# Patient Record
Sex: Male | Born: 1955
Health system: Southern US, Community
[De-identification: ages and names within clinical notes are randomized; demographics above are authoritative.]

## PROBLEM LIST (undated history)

## (undated) DIAGNOSIS — Z9109 Other allergy status, other than to drugs and biological substances: Secondary | ICD-10-CM

## (undated) DIAGNOSIS — N200 Calculus of kidney: Secondary | ICD-10-CM

## (undated) DIAGNOSIS — N159 Renal tubulo-interstitial disease, unspecified: Secondary | ICD-10-CM

## (undated) DIAGNOSIS — N400 Enlarged prostate without lower urinary tract symptoms: Secondary | ICD-10-CM

## (undated) DIAGNOSIS — C439 Malignant melanoma of skin, unspecified: Secondary | ICD-10-CM

## (undated) DIAGNOSIS — E785 Hyperlipidemia, unspecified: Secondary | ICD-10-CM

## (undated) DIAGNOSIS — I10 Essential (primary) hypertension: Secondary | ICD-10-CM

## (undated) HISTORY — PX: WISDOM TOOTH EXTRACTION: SHX21

## (undated) HISTORY — DX: Renal tubulo-interstitial disease, unspecified: N15.9

## (undated) HISTORY — DX: Malignant melanoma of skin, unspecified: C43.9

## (undated) HISTORY — DX: Calculus of kidney: N20.0

## (undated) HISTORY — DX: Hyperlipidemia, unspecified: E78.5

## (undated) HISTORY — DX: Essential (primary) hypertension: I10

## (undated) HISTORY — DX: Benign prostatic hyperplasia without lower urinary tract symptoms: N40.0

## (undated) HISTORY — PX: COLONOSCOPY: SHX174

## (undated) HISTORY — PX: MELANOMA EXCISION: SHX5266

## (undated) HISTORY — DX: Other allergy status, other than to drugs and biological substances: Z91.09

## (undated) HISTORY — PX: LITHOTRIPSY: SUR834

---

## 2009-12-05 ENCOUNTER — Ambulatory Visit (HOSPITAL_COMMUNITY): Admission: RE | Admit: 2009-12-05 | Discharge: 2009-12-05 | Payer: Self-pay | Admitting: Internal Medicine

## 2011-10-22 ENCOUNTER — Ambulatory Visit (INDEPENDENT_AMBULATORY_CARE_PROVIDER_SITE_OTHER): Payer: 59

## 2011-10-22 DIAGNOSIS — R51 Headache: Secondary | ICD-10-CM

## 2011-10-22 DIAGNOSIS — J019 Acute sinusitis, unspecified: Secondary | ICD-10-CM

## 2014-03-15 ENCOUNTER — Encounter: Payer: Self-pay | Admitting: Family Medicine

## 2014-03-15 ENCOUNTER — Ambulatory Visit (INDEPENDENT_AMBULATORY_CARE_PROVIDER_SITE_OTHER): Payer: 59 | Admitting: Family Medicine

## 2014-03-15 VITALS — BP 152/92 | HR 81 | Ht 70.0 in | Wt 199.0 lb

## 2014-03-15 DIAGNOSIS — M7580 Other shoulder lesions, unspecified shoulder: Secondary | ICD-10-CM

## 2014-03-15 DIAGNOSIS — M7541 Impingement syndrome of right shoulder: Secondary | ICD-10-CM | POA: Insufficient documentation

## 2014-03-15 DIAGNOSIS — I10 Essential (primary) hypertension: Secondary | ICD-10-CM

## 2014-03-15 DIAGNOSIS — M25819 Other specified joint disorders, unspecified shoulder: Secondary | ICD-10-CM

## 2014-03-15 DIAGNOSIS — N400 Enlarged prostate without lower urinary tract symptoms: Secondary | ICD-10-CM | POA: Insufficient documentation

## 2014-03-15 DIAGNOSIS — E785 Hyperlipidemia, unspecified: Secondary | ICD-10-CM | POA: Insufficient documentation

## 2014-03-15 DIAGNOSIS — Z85820 Personal history of malignant melanoma of skin: Secondary | ICD-10-CM | POA: Insufficient documentation

## 2014-03-15 MED ORDER — DOXAZOSIN MESYLATE 4 MG PO TABS: 4.0000 mg | ORAL_TABLET | Freq: Every day | ORAL | Status: DC

## 2014-03-15 NOTE — Progress Notes (Signed)
CC: Derrick Blackwell is a 58 y.o. male is here for Establish Care   Subjective: HPI:  Very pleasant CF [employer] here to establish care  Patient complains of right shoulder pain has been present for the past 2-4 weeks. Presently daily basis. Worse with trying to put his hand behind his back or abducting the humerus beyond 90. Localized to the top of the shoulder and nonradiating. Denies any recent or remote injury or overexertion. Denies any motor sensory disturbances in the right upper extremity. Denies any overlying skin changes or swelling. He has never had this before. Is moderate in severity. Absent when his arm is placed over his abdomen.  Complains of weak stream, dribbling of his urine at the end of urination, and awakening on average 2 times a night to urinate. Symptoms have been present for the last month. When his bladder is full he feels new low back pain right and left just lateral to the lumbar spine. No midline lumbar pain. Symptoms are moderate in severity is a daily basis. Nothing particularly makes better or worse. Symptoms began when he had left flank pain typical of his long history of nephrolithiasis however that resolved within hours and has been left with the above symptoms.  Reports a history of hypertension without any blood pressure medication recently. He does not, he's never been on blood pressure medication before.  Review of Systems - General ROS: negative for - chills, fever, night sweats, weight gain or weight loss Ophthalmic ROS: negative for - decreased vision Psychological ROS: negative for - anxiety or depression ENT ROS: negative for - hearing change, nasal congestion, tinnitus or allergies Hematological and Lymphatic ROS: negative for - bleeding problems, bruising or swollen lymph nodes Breast ROS: negative Respiratory ROS: no cough, shortness of breath, or wheezing Cardiovascular ROS: no chest pain or dyspnea on exertion Gastrointestinal ROS:  no abdominal pain, change in bowel habits, or black or bloody stools Genito-Urinary ROS: negative for - genital discharge, genital ulcers, incontinence or abnormal bleeding from genitals Musculoskeletal ROS: negative for - joint pain or muscle pain other than that described above Neurological ROS: negative for - headaches or memory loss Dermatological ROS: negative for lumps, mole changes, rash and skin lesion changes  Past Medical History  Diagnosis Date  . Hypertension   . Melanoma   . Hyperlipidemia     History reviewed. No pertinent past surgical history. Family History  Problem Relation Age of Onset  . Breast cancer Mother     History   Social History  . Marital Status: Married    Spouse Name: N/A    Number of Children: N/A  . Years of Education: N/A   Occupational History  . Not on file.   Social History Main Topics  . Smoking status: Unknown If Ever Smoked  . Smokeless tobacco: Not on file  . Alcohol Use: 0.5 oz/week    1 drink(s) per week  . Drug Use: No  . Sexual Activity: Yes    Partners: Female   Other Topics Concern  . Not on file   Social History Narrative  . No narrative on file     Objective: BP 152/92  Pulse 81  Ht 5\' 10"  (1.778 m)  Wt 199 lb (90.266 kg)  BMI 28.55 kg/m2  General: Alert and Oriented, No Acute Distress HEENT: Pupils equal, round, reactive to light. Conjunctivae clear.  Moist mucous membranes pharynx unremarkable Lungs: Clear to auscultation bilaterally, no wheezing/ronchi/rales.  Comfortable work  of breathing. Good air movement. Cardiac: Regular rate and rhythm. Normal S1/S2.  No murmurs, rubs, nor gallops.   Right shoulder exam reveals full range of motion and strength in all planes of motion and with individual rotator cuff testing. No overlying redness warmth or swelling.  Neer's test positive.  Hawkins test negative. Empty can positive. Crossarm test negative. O'Brien's test negative. Apprehension test negative. Speed's test  negative. Pain is reproduced with internal rotation of the humerus. Extremities: No peripheral edema.  Strong peripheral pulses.  Mental Status: No depression, anxiety, nor agitation. Skin: Warm and dry.  Assessment & Plan: Derrick Blackwell was seen today for establish care.  Diagnoses and associated orders for this visit:  Hypertension  BPH (benign prostatic hyperplasia) - doxazosin (CARDURA) 4 MG tablet; Take 1 tablet (4 mg total) by mouth daily.  Impingement syndrome of right shoulder    BPH: Uncontrolled chronic conditions start doxazosin Hypertension: Uncontrolled chronic condition began to doxazosin for both coverage of BPH and hypertensive issues. Impingement syndrome of the right shoulder. We discussed oral anti-inflammatories, home rehabilitation, and subacromial injection all of which are options for treatment of this. He prefers home rehabilitation and injection today.  Subacromial Shoulder Injection Procedure Note  Pre-operative Diagnosis: Right impingement syndrome  Post-operative Diagnosis: same  Indications: symptomatic relief  Anesthesia: Topical cold spray  Procedure Details   Verbal consent was obtained for the procedure. The shoulder was prepped with alcohol and the skin was anesthetized. A 25 gauge needle was advanced into the subacromial space through posterior approach without difficulty  The space was then injected with 2 ml 1% lidocaine and 2 ml of triamcinolone (KENALOG) 40mg /ml. The injection site was cleansed with isopropyl alcohol and a dressing was applied.  Complications:  None; patient tolerated the procedure well.   Return in about 3 months (around 06/15/2014).

## 2014-06-15 ENCOUNTER — Ambulatory Visit (INDEPENDENT_AMBULATORY_CARE_PROVIDER_SITE_OTHER): Payer: 59 | Admitting: Family Medicine

## 2014-06-15 ENCOUNTER — Ambulatory Visit (INDEPENDENT_AMBULATORY_CARE_PROVIDER_SITE_OTHER): Payer: 59

## 2014-06-15 ENCOUNTER — Encounter: Payer: Self-pay | Admitting: Family Medicine

## 2014-06-15 VITALS — BP 131/85 | HR 85 | Wt 188.0 lb

## 2014-06-15 DIAGNOSIS — M25819 Other specified joint disorders, unspecified shoulder: Secondary | ICD-10-CM

## 2014-06-15 DIAGNOSIS — M25519 Pain in unspecified shoulder: Secondary | ICD-10-CM

## 2014-06-15 DIAGNOSIS — M7541 Impingement syndrome of right shoulder: Secondary | ICD-10-CM

## 2014-06-15 DIAGNOSIS — I10 Essential (primary) hypertension: Secondary | ICD-10-CM

## 2014-06-15 DIAGNOSIS — M7580 Other shoulder lesions, unspecified shoulder: Secondary | ICD-10-CM

## 2014-06-15 DIAGNOSIS — R1031 Right lower quadrant pain: Secondary | ICD-10-CM

## 2014-06-15 DIAGNOSIS — N400 Enlarged prostate without lower urinary tract symptoms: Secondary | ICD-10-CM

## 2014-06-15 MED ORDER — DOXAZOSIN MESYLATE 4 MG PO TABS
4.0000 mg | ORAL_TABLET | Freq: Every day | ORAL | Status: DC
Start: 2014-06-15 — End: 2015-04-25

## 2014-06-15 NOTE — Progress Notes (Signed)
CC: Derrick Blackwell is a 58 y.o. male is here for Hypertension and Benign Prostatic Hypertrophy   Subjective: HPI:  Followup BPH: Since starting doxazosin he has noticed no known side effects. He states he is no longer awakening more than once to urinate in the middle the night and mostly not awakening at all. No longer having to strain or have any hesitancy with urination. He believes the medications working great for his BPH  Followup hypertension: No outside blood pressures to report. Denies chest pain shortness of breath orthopnea nor peripheral edema.  Impingement syndrome of the right shoulder: Patient states the symptoms were relieved for 3 days after subacromial injection at his last visit. They're now back worse with abduction of the arm beyond 90 or any overhead activities. Described as a sharpness and pulling sensation in the right shoulder and non-radiating. Denies any motor or sensory disturbances nor neck pain.  Complains of right lower quadrant abdominal pain has been present for the last year. It is described as moderate and severe and only occurs at night after he's been asleep and when he rotates to the left quickly. It is never present during the mornings or when out of bed. Nothing else makes better or worse. Has never been accompanied by habits, diarrhea, constipation, nausea, vomiting, nor accompanied by fevers   Review Of Systems Outlined In HPI  Past Medical History  Diagnosis Date  . Hypertension   . Melanoma   . Hyperlipidemia     No past surgical history on file. Family History  Problem Relation Age of Onset  . Breast cancer Mother     History   Social History  . Marital Status: Married    Spouse Name: N/A    Number of Children: N/A  . Years of Education: N/A   Occupational History  . Not on file.   Social History Main Topics  . Smoking status: Unknown If Ever Smoked  . Smokeless tobacco: Not on file  . Alcohol Use: 0.5 oz/week    1 drink(s) per  week  . Drug Use: No  . Sexual Activity: Yes    Partners: Female   Other Topics Concern  . Not on file   Social History Narrative  . No narrative on file     Objective: BP 131/85  Pulse 85  Wt 188 lb (85.276 kg)  General: Alert and Oriented, No Acute Distress HEENT: Pupils equal, round, reactive to light. Conjunctivae clear.  Moist mucous membranes pharynx unremarkable Lungs: Clear to auscultation bilaterally, no wheezing/ronchi/rales.  Comfortable work of breathing. Good air movement. Cardiac: Regular rate and rhythm. Normal S1/S2.  No murmurs, rubs, nor gallops.   Abdomen: Normal bowel sounds, soft and non tender without rebound or guarding. In the right lower quadrant just medial to the ASIS there is a small to fingerpad witdth shallow buldge that occurs when coughing but there is no palpable abdominal wall defect Extremities: No peripheral edema.  Strong peripheral pulses.  Mental Status: No depression, anxiety, nor agitation. Skin: Warm and dry.  Assessment & Plan: Derrick Blackwell was seen today for hypertension and benign prostatic hypertrophy.  Diagnoses and associated orders for this visit:  BPH (benign prostatic hyperplasia) - doxazosin (CARDURA) 4 MG tablet; Take 1 tablet (4 mg total) by mouth daily.  Essential hypertension  Impingement syndrome of right shoulder - DG Shoulder Right; Future  RLQ abdominal pain    DBH: Controlled continue doxazosin Essential hypertension: Controlled continue doxazosin Impingement syndrome: Shoulder films reveal mild  degenerative changes on the bottom of a.c. joint however I cannot 100% attributes this to his pain, I recommended a followup in sports medicine for further evaluation to see Dr. Darene Lamer. For the time being begin home rehabilitation plans were provided to him at the last visit Right lower quadrant pain: Reassurance provided that this is most likely musculoskeletal and due to weak abdominal musculature, he was provided with a home  rehabilitation plan to focus on rehabilitating his core to be performed on a daily basis the next 3-4 weeks  Return in about 3 months (around 09/15/2014).

## 2015-04-11 ENCOUNTER — Other Ambulatory Visit: Payer: Self-pay | Admitting: Family Medicine

## 2015-04-25 ENCOUNTER — Ambulatory Visit (INDEPENDENT_AMBULATORY_CARE_PROVIDER_SITE_OTHER): Payer: 59 | Admitting: Family Medicine

## 2015-04-25 ENCOUNTER — Encounter: Payer: Self-pay | Admitting: Family Medicine

## 2015-04-25 VITALS — BP 155/92 | HR 72 | Wt 206.0 lb

## 2015-04-25 DIAGNOSIS — Z0000 Encounter for general adult medical examination without abnormal findings: Secondary | ICD-10-CM

## 2015-04-25 DIAGNOSIS — Z23 Encounter for immunization: Secondary | ICD-10-CM | POA: Diagnosis not present

## 2015-04-25 LAB — CBC
HCT: 44.6 % (ref 39.0–52.0)
HEMOGLOBIN: 15.3 g/dL (ref 13.0–17.0)
MCH: 28.7 pg (ref 26.0–34.0)
MCHC: 34.3 g/dL (ref 30.0–36.0)
MCV: 83.5 fL (ref 78.0–100.0)
MPV: 11 fL (ref 8.6–12.4)
Platelets: 146 10*3/uL — ABNORMAL LOW (ref 150–400)
RBC: 5.34 MIL/uL (ref 4.22–5.81)
RDW: 13.7 % (ref 11.5–15.5)
WBC: 5.4 10*3/uL (ref 4.0–10.5)

## 2015-04-25 LAB — COMPLETE METABOLIC PANEL WITH GFR
ALK PHOS: 44 U/L (ref 39–117)
ALT: 16 U/L (ref 0–53)
AST: 14 U/L (ref 0–37)
Albumin: 4.5 g/dL (ref 3.5–5.2)
BILIRUBIN TOTAL: 0.5 mg/dL (ref 0.2–1.2)
BUN: 14 mg/dL (ref 6–23)
CO2: 29 mEq/L (ref 19–32)
Calcium: 9.3 mg/dL (ref 8.4–10.5)
Chloride: 98 mEq/L (ref 96–112)
Creat: 0.91 mg/dL (ref 0.50–1.35)
GFR, Est African American: >89 mL/min
GLUCOSE: 125 mg/dL — AB (ref 70–99)
POTASSIUM: 4.2 meq/L (ref 3.5–5.3)
Sodium: 136 mEq/L (ref 135–145)
Total Protein: 7.2 g/dL (ref 6.0–8.3)

## 2015-04-25 LAB — LIPID PANEL
CHOL/HDL RATIO: 7.8 ratio
Cholesterol: 256 mg/dL — ABNORMAL HIGH (ref 0–200)
HDL: 33 mg/dL — AB (ref >=40)
LDL CALC: 157 mg/dL — AB (ref 0–99)
TRIGLYCERIDES: 332 mg/dL — AB (ref <150)
VLDL: 66 mg/dL — ABNORMAL HIGH (ref 0–40)

## 2015-04-25 MED ORDER — DOXAZOSIN MESYLATE 4 MG PO TABS: 4.0000 mg | ORAL_TABLET | Freq: Every day | ORAL | Status: DC

## 2015-04-25 NOTE — Progress Notes (Signed)
CC: Derrick Blackwell is a 59 y.o. male is here for Annual Exam   Subjective: HPI:  Colonoscopy: He is competent he had a colonoscopy within the last 10 years and it was normal, he thinks next year will be the 10th year anniversary Prostate: Discussed screening risks/beneifts with patient during today's visit he is open to the idea of getting a PSA    Influenza Vaccine: Up-to-date Pneumovax: Up-to-date Td/Tdap: Overdue he will receive Tdap today Zoster: (Start 59 yo)  Requesting refills on doxazosin that he's run out of.  Requesting complete physical exam  Review of Systems - General ROS: negative for - chills, fever, night sweats, weight gain or weight loss Ophthalmic ROS: negative for - decreased vision Psychological ROS: negative for - anxiety or depression ENT ROS: negative for - hearing change, nasal congestion, tinnitus or allergies Hematological and Lymphatic ROS: negative for - bleeding problems, bruising or swollen lymph nodes Breast ROS: negative Respiratory ROS: no cough, shortness of breath, or wheezing Cardiovascular ROS: no chest pain or dyspnea on exertion Gastrointestinal ROS: no abdominal pain, change in bowel habits, or black or bloody stools Genito-Urinary ROS: negative for - genital discharge, genital ulcers, incontinence or abnormal bleeding from genitals Musculoskeletal ROS: negative for - joint pain or muscle pain Neurological ROS: negative for - headaches or memory loss Dermatological ROS: negative for lumps, mole changes, rash and skin lesion changes  Past Medical History  Diagnosis Date  . Hypertension   . Melanoma   . Hyperlipidemia     No past surgical history on file. Family History  Problem Relation Age of Onset  . Breast cancer Mother     History   Social History  . Marital Status: Married    Spouse Name: N/A  . Number of Children: N/A  . Years of Education: N/A   Occupational History  . Not on file.   Social History Main Topics  .  Smoking status: Unknown If Ever Smoked  . Smokeless tobacco: Not on file  . Alcohol Use: 0.5 oz/week    1 drink(s) per week  . Drug Use: No  . Sexual Activity:    Partners: Female   Other Topics Concern  . Not on file   Social History Narrative     Objective: BP 155/92 mmHg  Pulse 72  Wt 206 lb (93.441 kg)  General: No Acute Distress HEENT: Atraumatic, normocephalic, conjunctivae normal without scleral icterus.  No nasal discharge, hearing grossly intact, TMs with good landmarks bilaterally with no middle ear abnormalities, posterior pharynx clear without oral lesions. Neck: Supple, trachea midline, no cervical nor supraclavicular adenopathy. Pulmonary: Clear to auscultation bilaterally without wheezing, rhonchi, nor rales. Cardiac: Regular rate and rhythm.  No murmurs, rubs, nor gallops. No peripheral edema.  2+ peripheral pulses bilaterally. Abdomen: Bowel sounds normal.  No masses.  Non-tender without rebound.  Negative Murphy's sign. MSK: Grossly intact, no signs of weakness.  Full strength throughout upper and lower extremities.  Full ROM in upper and lower extremities.  No midline spinal tenderness. Neuro: Gait unremarkable, CN II-XII grossly intact.  C5-C6 Reflex 2/4 Bilaterally, L4 Reflex 2/4 Bilaterally.  Cerebellar function intact. Skin: No rashes. Psych: Alert and oriented to person/place/time.  Thought process normal. No anxiety/depression.  Assessment & Plan: Talyn was seen today for annual exam.  Diagnoses and all orders for this visit:  Annual physical exam Orders: -     Lipid panel -     PSA -     CBC -  COMPLETE METABOLIC PANEL WITH GFR -     Tdap vaccine greater than or equal to 7yo IM  Other orders -     doxazosin (CARDURA) 4 MG tablet; Take 1 tablet (4 mg total) by mouth daily.   Healthy lifestyle interventions including but not limited to regular exercise, a healthy low fat diet, moderation of salt intake, the dangers of  tobacco/alcohol/recreational drug use, nutrition supplementation, and accident avoidance were discussed with the patient and a handout was provided for future reference.  Return in about 3 months (around 07/26/2015) for BP Review.

## 2015-04-26 ENCOUNTER — Telehealth: Payer: Self-pay | Admitting: Family Medicine

## 2015-04-26 DIAGNOSIS — R739 Hyperglycemia, unspecified: Secondary | ICD-10-CM

## 2015-04-26 DIAGNOSIS — E785 Hyperlipidemia, unspecified: Secondary | ICD-10-CM

## 2015-04-26 LAB — PSA: PSA: 0.96 ng/mL (ref <=4.00)

## 2015-04-26 NOTE — Telephone Encounter (Signed)
Pt.notified

## 2015-04-26 NOTE — Telephone Encounter (Signed)
Derrick Blackwell, Will you please let patient know that his PSA prostate test, blood cell counts, liver function, and kidney function were normal.  His cholesterol was moderately elevated to a degree that puts him at risk of developing cardiovascular disease.  This can be improved with engaging in 30-45 minutes of moderate exercise most days of the week, reducing cholesterol in the diet, and avoiding all fried foods.  I'd recommend he return to have this rechecked in three months.  Also his blood sugar was moderately elevated and I'd recommend he either have an A1c added on to his blood work from yesterday or return to the lab to have an A1c checked if the lab can't do the add on.  Lab slip in your in box.

## 2015-07-31 ENCOUNTER — Encounter: Payer: Self-pay | Admitting: *Deleted

## 2015-07-31 ENCOUNTER — Emergency Department
Admission: EM | Admit: 2015-07-31 | Discharge: 2015-07-31 | Disposition: A | Discharge status: Home / Self Care | Payer: 59 | Source: Home / Self Care | Attending: Family Medicine | Admitting: Family Medicine

## 2015-07-31 DIAGNOSIS — S80811A Abrasion, right lower leg, initial encounter: Secondary | ICD-10-CM | POA: Diagnosis not present

## 2015-07-31 MED ORDER — MUPIROCIN 2 % EX OINT: 1.0000 "application " | TOPICAL_OINTMENT | Freq: Three times a day (TID) | CUTANEOUS | Status: DC

## 2015-07-31 NOTE — ED Provider Notes (Signed)
CSN: [id number]     Arrival date & time 07/31/15  1756 History   First MD Initiated Contact with Patient 07/31/15 1819     Chief Complaint  Patient presents with  . Wound Infection     HPI Comments: Patient reports that he lacerated his lower legs four days ago on barbed wire.  He has been applying Neosporin ointment, and he wonders if a lesion on his right lower leg is infected.  His Tdap is current.  Patient is a 59 y.o. male presenting with skin laceration. The history is provided by the patient.  Laceration Location:  Leg Leg laceration location:  R lower leg Length (cm):  1.5 Depth:  Cutaneous Quality: straight   Bleeding: controlled   Time since incident:  4 days Laceration mechanism:  Metal edge Pain details:    Quality:  Aching   Severity:  No pain   Timing:  Constant   Progression:  Improving Foreign body present:  No foreign bodies Relieved by: Neosporin ointment. Tetanus status:  Up to date   Past Medical History  Diagnosis Date  . Hypertension   . Melanoma ([place])   . Hyperlipidemia    History reviewed. No pertinent past surgical history. Family History  Problem Relation Age of Onset  . Breast cancer Mother    Social History  Substance Use Topics  . Smoking status: Unknown If Ever Smoked  . Smokeless tobacco: None  . Alcohol Use: 0.5 oz/week    1 drink(s) per week    Review of Systems  Constitutional: Negative for fever and chills.    Allergies  Penicillins  Home Medications   Prior to Admission medications   Medication Sig Start Date End Date Taking? Authorizing Provider  Cetirizine HCl (ZYRTEC ALLERGY PO) Take by mouth.    Historical Provider, MD  doxazosin (CARDURA) 4 MG tablet Take 1 tablet (4 mg total) by mouth daily. 04/25/15   Marcial Pacas, DO  mupirocin ointment (BACTROBAN) 2 % Apply 1 application topically 3 (three) times daily. 07/31/15   Kandra Nicolas, MD  VITAMIN D, CHOLECALCIFEROL, PO Take 3,000 mg by mouth.     Historical Provider,  MD   Meds Ordered and Administered this Visit  Medications - No data to display  BP 164/97 mmHg  Pulse 70  Temp(Src) 98 F (36.7 C) (Oral)  Resp 18  Ht 5\' 10"  (1.778 m)  Wt 204 lb (92.534 kg)  BMI 29.27 kg/m2  SpO2 100% No data found.   Physical Exam  Constitutional: He is oriented to person, place, and time. He appears well-developed and well-nourished. No distress.  HENT:  Head: Atraumatic.  Eyes: Pupils are equal, round, and reactive to light.  Musculoskeletal:       Legs: Anterior aspects of both lower legs have several healing superficial abrasions.  Right lower leg has a 1.5cm by 69mm healing abrasion with minimal erythema.  No swelling or tenderness to palpation.  Neurological: He is alert and oriented to person, place, and time.  Skin: Skin is warm and dry.  Nursing note and vitals reviewed.   ED Course  Procedures  None    MDM   1. Abrasion of lower leg, right, initial encounter    Although wounds do not definitely appear infected, will write Rx for Bactroban ointment. Apply antibiotic ointment daily and keep wounds bandaged until healed. Followup with Family Doctor if not improved in one week.     Kandra Nicolas, MD 07/31/15 [phone]

## 2015-07-31 NOTE — ED Notes (Signed)
Pt c/o infected laceration on his RT lower leg x 4 days. He reports cutting it on a barbwire fence. Tdap 2016.

## 2015-07-31 NOTE — Discharge Instructions (Signed)
Apply antibiotic ointment daily and keep wounds bandaged until healed.   Abrasion An abrasion is a cut or scrape on the outer surface of your skin. An abrasion does not extend through all of the layers of your skin. It is important to care for your abrasion properly to prevent infection. CAUSES Most abrasions are caused by falling on or gliding across the ground or another surface. When your skin rubs on something, the outer and inner layer of skin rubs off.  SYMPTOMS A cut or scrape is the main symptom of this condition. The scrape may be bleeding, or it may appear red or pink. If there was an associated fall, there may be an underlying bruise. DIAGNOSIS An abrasion is diagnosed with a physical exam. TREATMENT Treatment for this condition depends on how large and deep the abrasion is. Usually, your abrasion will be cleaned with water and mild soap. This removes any dirt or debris that may be stuck. An antibiotic ointment may be applied to the abrasion to help prevent infection. A bandage (dressing) may be placed on the abrasion to keep it clean. You may also need a tetanus shot. HOME CARE INSTRUCTIONS Medicines  Take or apply medicines only as directed by your health care provider.  If you were prescribed an antibiotic ointment, finish all of it even if you start to feel better. Wound Care  Clean the wound with mild soap and water 2-3 times per day or as directed by your health care provider. Pat your wound dry with a clean towel. Do not rub it.  There are many different ways to close and cover a wound. Follow instructions from your health care provider about:  Wound care.  Dressing changes and removal.  Check your wound every day for signs of infection. Watch for:  Redness, swelling, or pain.  Fluid, blood, or pus. General Instructions  Keep the dressing dry as directed by your health care provider. Do not take baths, swim, use a hot tub, or do anything that would put your  wound underwater until your health care provider approves.  If there is swelling, raise (elevate) the injured area above the level of your heart while you are sitting or lying down.  Keep all follow-up visits as directed by your health care provider. This is important. SEEK MEDICAL CARE IF:  You received a tetanus shot and you have swelling, severe pain, redness, or bleeding at the injection site.  Your pain is not controlled with medicine.  You have increased redness, swelling, or pain at the site of your wound. SEEK IMMEDIATE MEDICAL CARE IF:  You have a red streak going away from your wound.  You have a fever.  You have fluid, blood, or pus coming from your wound.  You notice a bad smell coming from your wound or your dressing.   This information is not intended to replace advice given to you by your health care provider. Make sure you discuss any questions you have with your health care provider.   Document Released: 07/23/2005 Document Revised: 07/04/2015 Document Reviewed: 10/11/2014 Elsevier Interactive Patient Education [employer].

## 2016-05-06 ENCOUNTER — Other Ambulatory Visit: Payer: Self-pay | Admitting: Family Medicine

## 2016-05-28 ENCOUNTER — Encounter: Payer: Self-pay | Admitting: Family Medicine

## 2016-05-28 ENCOUNTER — Ambulatory Visit (INDEPENDENT_AMBULATORY_CARE_PROVIDER_SITE_OTHER): Payer: 59 | Admitting: Family Medicine

## 2016-05-28 VITALS — BP 149/89 | HR 71 | Wt 202.0 lb

## 2016-05-28 DIAGNOSIS — Z0000 Encounter for general adult medical examination without abnormal findings: Secondary | ICD-10-CM | POA: Diagnosis not present

## 2016-05-28 DIAGNOSIS — Z800 Family history of malignant neoplasm of digestive organs: Secondary | ICD-10-CM | POA: Diagnosis not present

## 2016-05-28 DIAGNOSIS — Z1211 Encounter for screening for malignant neoplasm of colon: Secondary | ICD-10-CM | POA: Diagnosis not present

## 2016-05-28 LAB — LIPID PANEL
CHOLESTEROL: 263 mg/dL — AB (ref 125–200)
HDL: 44 mg/dL (ref >=40)
LDL Cholesterol: 167 mg/dL — ABNORMAL HIGH (ref <130)
TRIGLYCERIDES: 260 mg/dL — AB (ref <150)
Total CHOL/HDL Ratio: 6 Ratio — ABNORMAL HIGH (ref <=5.0)
VLDL: 52 mg/dL — AB (ref <30)

## 2016-05-28 LAB — COMPLETE METABOLIC PANEL WITH GFR
ALT: 19 U/L (ref 9–46)
AST: 14 U/L (ref 10–35)
Albumin: 4.4 g/dL (ref 3.6–5.1)
Alkaline Phosphatase: 39 U/L — ABNORMAL LOW (ref 40–115)
BILIRUBIN TOTAL: 0.4 mg/dL (ref 0.2–1.2)
BUN: 19 mg/dL (ref 7–25)
CALCIUM: 9.1 mg/dL (ref 8.6–10.3)
CHLORIDE: 102 mmol/L (ref 98–110)
CO2: 27 mmol/L (ref 20–31)
CREATININE: 0.91 mg/dL (ref 0.70–1.25)
GFR, Est Non African American: >89 mL/min (ref >=60)
Glucose, Bld: 119 mg/dL — ABNORMAL HIGH (ref 65–99)
Potassium: 4.5 mmol/L (ref 3.5–5.3)
Sodium: 139 mmol/L (ref 135–146)
TOTAL PROTEIN: 7.2 g/dL (ref 6.1–8.1)

## 2016-05-28 LAB — CBC
HCT: 44.4 % (ref 38.5–50.0)
Hemoglobin: 15.3 g/dL (ref 13.2–17.1)
MCH: 29.1 pg (ref 27.0–33.0)
MCHC: 34.5 g/dL (ref 32.0–36.0)
MCV: 84.4 fL (ref 80.0–100.0)
MPV: 10.5 fL (ref 7.5–12.5)
PLATELETS: 159 10*3/uL (ref 140–400)
RBC: 5.26 MIL/uL (ref 4.20–5.80)
RDW: 14 % (ref 11.0–15.0)
WBC: 5.3 10*3/uL (ref 3.8–10.8)

## 2016-05-28 MED ORDER — ZOSTER VACCINE LIVE 19400 UNT/0.65ML ~~LOC~~ SUSR: 0.6500 mL | Freq: Once | SUBCUTANEOUS | 0 refills | Status: AC

## 2016-05-28 MED ORDER — DOXAZOSIN MESYLATE 4 MG PO TABS: 4.0000 mg | ORAL_TABLET | Freq: Every day | ORAL | 1 refills | Status: DC

## 2016-05-28 NOTE — Progress Notes (Signed)
CC: Derrick Blackwell is a 60 y.o. male is here for Annual Exam (pt is fasting )   Subjective: HPI:  Colonoscopy: Due for colonoscopy, referral has been placed Prostate: Discussed screening risks/beneifts with patient today, he like to get a PSA  Influenza Vaccine: No current indication Pneumovax: No current indication Td/Tdap: Up-to-date Zoster: Encouraged to get this via his pharmacy, a written prescription was provided  Requesting complete physical exam, no complaints, states blood pressure is better at home  Review of Systems - General ROS: negative for - chills, fever, night sweats, weight gain or weight loss Ophthalmic ROS: negative for - decreased vision Psychological ROS: negative for - anxiety or depression ENT ROS: negative for - hearing change, nasal congestion, tinnitus or allergies Hematological and Lymphatic ROS: negative for - bleeding problems, bruising or swollen lymph nodes Breast ROS: negative Respiratory ROS: no cough, shortness of breath, or wheezing Cardiovascular ROS: no chest pain or dyspnea on exertion Gastrointestinal ROS: no abdominal pain, change in bowel habits, or black or bloody stools Genito-Urinary ROS: negative for - genital discharge, genital ulcers, incontinence or abnormal bleeding from genitals Musculoskeletal ROS: negative for - joint pain or muscle pain Neurological ROS: negative for - headaches or memory loss Dermatological ROS: negative for lumps, mole changes, rash and skin lesion changes  Past Medical History:  Diagnosis Date  . Hyperlipidemia   . Hypertension   . Melanoma ([place])     No past surgical history on file. Family History  Problem Relation Age of Onset  . Breast cancer Mother     Social History   Social History  . Marital status: Married    Spouse name: N/A  . Number of children: N/A  . Years of education: N/A   Occupational History  . Not on file.   Social History Main Topics  . Smoking status: Unknown If Ever  Smoked  . Smokeless tobacco: Not on file  . Alcohol use 0.5 oz/week    1 drink(s) per week  . Drug use: No  . Sexual activity: Yes    Partners: Female   Other Topics Concern  . Not on file   Social History Narrative  . No narrative on file     Objective: BP (!) 149/89   Pulse 71   Wt 202 lb (91.6 kg)   BMI 28.98 kg/m   General: No Acute Distress HEENT: Atraumatic, normocephalic, conjunctivae normal without scleral icterus.  No nasal discharge, hearing grossly intact, TMs with good landmarks bilaterally with no middle ear abnormalities, posterior pharynx clear without oral lesions. Neck: Supple, trachea midline, no cervical nor supraclavicular adenopathy. Pulmonary: Clear to auscultation bilaterally without wheezing, rhonchi, nor rales. Cardiac: Regular rate and rhythm.  No murmurs, rubs, nor gallops. No peripheral edema.  2+ peripheral pulses bilaterally. Abdomen: Bowel sounds normal.  No masses.  Non-tender without rebound.  Negative Murphy's sign. MSK: Grossly intact, no signs of weakness.  Full strength throughout upper and lower extremities.  Full ROM in upper and lower extremities.  No midline spinal tenderness. Neuro: Gait unremarkable, CN II-XII grossly intact.  C5-C6 Reflex 2/4 Bilaterally, L4 Reflex 2/4 Bilaterally.  Cerebellar function intact. Skin: No rashes. Psych: Alert and oriented to person/place/time.  Thought process normal. No anxiety/depression. Assessment & Plan: [name] was seen today for annual exam.  Diagnoses and all orders for this visit:  Special screening for malignant neoplasms, colon -     Ambulatory referral to Gastroenterology  Annual physical exam -  Lipid panel -     COMPLETE METABOLIC PANEL WITH GFR -     CBC -     PSA -     Ambulatory referral to Gastroenterology  Family history of colon cancer  Other orders -     doxazosin (CARDURA) 4 MG tablet; Take 1 tablet (4 mg total) by mouth daily. -     Zoster Vaccine Live, PF, (ZOSTAVAX)  19400 UNT/0.65ML injection; Inject 19,400 Units into the skin once.   Healthy lifestyle interventions including but not limited to regular exercise, a healthy low fat diet, moderation of salt intake, the dangers of tobacco/alcohol/recreational drug use, nutrition supplementation, and accident avoidance were discussed with the patient and a handout was provided for future reference.  Discussed with this patient that I will be resigning from my position here with [facility] in September in order to stay with my family who will be moving to [facility]. I let him know about the providers that are still accepting patients and I feel that this individual will be under great care if he/she stays here with [facility]. Information regarding providers are still excepting patients was provided.  Return in about 1 year (around 05/28/2017) for Annual Physicals, sooner if BP remains above 140/90.

## 2016-05-29 ENCOUNTER — Telehealth: Payer: Self-pay | Admitting: Family Medicine

## 2016-05-29 DIAGNOSIS — R739 Hyperglycemia, unspecified: Secondary | ICD-10-CM

## 2016-05-29 DIAGNOSIS — E785 Hyperlipidemia, unspecified: Secondary | ICD-10-CM

## 2016-05-29 LAB — PSA: PSA: 1.16 ng/mL (ref <=4.00)

## 2016-05-29 MED ORDER — ATORVASTATIN CALCIUM 10 MG PO TABS: 10.0000 mg | ORAL_TABLET | Freq: Every day | ORAL | 3 refills | Status: DC

## 2016-05-29 NOTE — Telephone Encounter (Signed)
We will you please let patient know that his PSA prostate test, kidney function, liver function and blood cell counts were normal. His blood sugar was slightly elevated I would recommend that he have a 3 month average A1c checked. Lab slip in your in box if the lab is unable to add this on. Additionally his LDL cholesterol is elevated I would recommend starting on a cholesterol medicine called atorvastatin. I'll send this to his [employer].

## 2016-05-29 NOTE — Telephone Encounter (Signed)
Pt notified and A1c added

## 2016-05-30 ENCOUNTER — Encounter: Payer: Self-pay | Admitting: Family Medicine

## 2016-05-30 DIAGNOSIS — R7303 Prediabetes: Secondary | ICD-10-CM | POA: Insufficient documentation

## 2016-05-30 LAB — HEMOGLOBIN A1C
HEMOGLOBIN A1C: 6 % — AB (ref <5.7)
MEAN PLASMA GLUCOSE: 126 mg/dL

## 2016-06-19 ENCOUNTER — Encounter: Payer: Self-pay | Admitting: Family Medicine

## 2016-06-19 ENCOUNTER — Ambulatory Visit (INDEPENDENT_AMBULATORY_CARE_PROVIDER_SITE_OTHER): Payer: 59 | Admitting: Family Medicine

## 2016-06-19 VITALS — BP 154/83 | HR 85 | Ht 70.0 in | Wt 204.0 lb

## 2016-06-19 DIAGNOSIS — Z1159 Encounter for screening for other viral diseases: Secondary | ICD-10-CM | POA: Diagnosis not present

## 2016-06-19 DIAGNOSIS — H9221 Otorrhagia, right ear: Secondary | ICD-10-CM | POA: Diagnosis not present

## 2016-06-19 LAB — COMPREHENSIVE METABOLIC PANEL
ALK PHOS: 36 U/L — AB (ref 40–115)
ALT: 22 U/L (ref 9–46)
AST: 15 U/L (ref 10–35)
Albumin: 4.3 g/dL (ref 3.6–5.1)
BUN: 20 mg/dL (ref 7–25)
CO2: 26 mmol/L (ref 20–31)
CREATININE: 0.93 mg/dL (ref 0.70–1.25)
Calcium: 9.7 mg/dL (ref 8.6–10.3)
Chloride: 105 mmol/L (ref 98–110)
Glucose, Bld: 115 mg/dL — ABNORMAL HIGH (ref 65–99)
Potassium: 4.2 mmol/L (ref 3.5–5.3)
SODIUM: 141 mmol/L (ref 135–146)
TOTAL PROTEIN: 6.8 g/dL (ref 6.1–8.1)
Total Bilirubin: 0.4 mg/dL (ref 0.2–1.2)

## 2016-06-19 LAB — CBC
HCT: 43.8 % (ref 38.5–50.0)
HEMOGLOBIN: 14.6 g/dL (ref 13.2–17.1)
MCH: 28.3 pg (ref 27.0–33.0)
MCHC: 33.3 g/dL (ref 32.0–36.0)
MCV: 84.9 fL (ref 80.0–100.0)
MPV: 10.9 fL (ref 7.5–12.5)
PLATELETS: 141 10*3/uL (ref 140–400)
RBC: 5.16 MIL/uL (ref 4.20–5.80)
RDW: 13.8 % (ref 11.0–15.0)
WBC: 5.2 10*3/uL (ref 3.8–10.8)

## 2016-06-19 LAB — PROTIME-INR
INR: 1.1
PROTHROMBIN TIME: 11.2 s (ref 9.0–11.5)

## 2016-06-19 MED ORDER — NEOMYCIN-POLYMYXIN-HC 3.5-10000-1 OT SOLN: 4.0000 [drp] | Freq: Four times a day (QID) | OTIC | 0 refills | Status: DC

## 2016-06-19 NOTE — Patient Instructions (Signed)
Thank you for coming in today. Use the ear drops if it becomes painful.  Return in 2-3 months for recheck.  Return sooner if needed.  Get labs today.

## 2016-06-19 NOTE — Progress Notes (Signed)
       Derrick Blackwell is a 60 y.o. male who presents to [place] today for right ear bleeding.  Patient admits to bleeding from his right ear that began last night.  He went to sleep with a Kleenex in his ear and said the tissue was saturated with blood when he woke up this morning.  Denies current bleeding.  No prior episodes or family history of abnormal bleeding or bruising.  Patient denies ear trauma and recent illness.  No ear pain, pressure, dizziness, or difficulties with hearing.  No other complaints at this time.   Health maintenance:  Patient is due for Hepatitis C screening, HIV screening, Colonoscopy, Zostavax, and Influenza.  He has been prescribed Zostavax but has not filled it yet.  Patient has been in contact with a gastroenterologist but has not scheduled a colonoscopy yet due to scheduling conflicts.   Past Medical History:  Diagnosis Date  . Hyperlipidemia   . Hypertension   . Melanoma ([place])    No past surgical history on file. Social History  Substance Use Topics  . Smoking status: Unknown If Ever Smoked  . Smokeless tobacco: Not on file  . Alcohol use 0.5 oz/week    1 drink(s) per week   family history includes Breast cancer in his mother.  ROS as above: No headache, visual changes, nausea, vomiting, diarrhea, constipation, dizziness, abdominal pain, skin rash, fevers, chills, night sweats, weight loss, swollen lymph nodes, body aches, joint swelling, muscle aches, chest pain, shortness of breath, mood changes, visual or auditory hallucinations.    Medications: Current Outpatient Prescriptions  Medication Sig Dispense Refill  . atorvastatin (LIPITOR) 10 MG tablet Take 1 tablet (10 mg total) by mouth daily. 90 tablet 3  . doxazosin (CARDURA) 4 MG tablet Take 1 tablet (4 mg total) by mouth daily. 90 tablet 1   No current facility-administered  medications for this visit.    Allergies  Allergen Reactions  . Penicillins      Exam:  BP (!) 154/83   Pulse 85   Ht 5\' 10"  (1.778 m)   Wt 204 lb (92.5 kg)   BMI 29.27 kg/m  Gen: Well NAD HEENT: EOMI,  MMM.  Right ear canal with dried blood and clot, no evidence of TM rupture.  Left TM pearly grey. Non-tender with ear motion.  Lungs: Normal work of breathing. CTABL Heart: RRR no MRG Abd: NABS, Soft. Nondistended, Nontender Exts: Brisk capillary refill, warm and well perfused.   No results found for this or any previous visit (from the past 24 hour(s)). No results found.    Assessment and Plan: 60 y.o. male with right ear bleeding of unknown etiology. The bleeding is likely from unknown microtrauma or scratch to the ear canal.  Will check CBC, CMP, and INR to rule out problems of hemostasis.   - Cortisporin for ear discomfort PRN as a backup.   Health maintenance - HIV and Hep C screening - Wait for influenza vaccine - Will fill his Zostavax prescription and schedule a colonoscopy - Follow up in 3 months to recheck lipids and liver enzymes since he recently was started of Lipitor   No orders of the defined types were placed in this encounter.   Discussed warning signs or symptoms. Please see discharge instructions. Patient expresses understanding.

## 2016-06-20 LAB — HEPATITIS C ANTIBODY: HCV Ab: NEGATIVE

## 2016-07-01 ENCOUNTER — Encounter: Payer: Self-pay | Admitting: Gastroenterology

## 2016-08-22 ENCOUNTER — Ambulatory Visit (AMBULATORY_SURGERY_CENTER): Payer: Self-pay

## 2016-08-22 ENCOUNTER — Encounter: Payer: Self-pay | Admitting: Gastroenterology

## 2016-08-22 VITALS — Ht 70.0 in | Wt 211.0 lb

## 2016-08-22 DIAGNOSIS — Z800 Family history of malignant neoplasm of digestive organs: Secondary | ICD-10-CM

## 2016-08-22 MED ORDER — SUPREP BOWEL PREP KIT 17.5-3.13-1.6 GM/177ML PO SOLN: 1.0000 | Freq: Once | ORAL | 0 refills | Status: AC

## 2016-08-22 NOTE — Progress Notes (Signed)
No allergies to eggs or soy No past problems with anesthesia Except n/v with "synthetic pain killer" No diet meds No home oxygen  Declined emmi

## 2016-09-05 ENCOUNTER — Encounter: Payer: 59 | Admitting: Gastroenterology

## 2016-12-08 ENCOUNTER — Other Ambulatory Visit: Payer: Self-pay

## 2016-12-08 MED ORDER — DOXAZOSIN MESYLATE 4 MG PO TABS: 4.0000 mg | ORAL_TABLET | Freq: Every day | ORAL | 0 refills | Status: DC

## 2017-01-05 ENCOUNTER — Encounter: Payer: Self-pay | Admitting: Family Medicine

## 2017-01-05 ENCOUNTER — Ambulatory Visit (INDEPENDENT_AMBULATORY_CARE_PROVIDER_SITE_OTHER): Payer: 59 | Admitting: Family Medicine

## 2017-01-05 VITALS — BP 170/87 | HR 85 | Wt 221.0 lb

## 2017-01-05 DIAGNOSIS — E782 Mixed hyperlipidemia: Secondary | ICD-10-CM | POA: Diagnosis not present

## 2017-01-05 DIAGNOSIS — I10 Essential (primary) hypertension: Secondary | ICD-10-CM | POA: Diagnosis not present

## 2017-01-05 DIAGNOSIS — R5383 Other fatigue: Secondary | ICD-10-CM

## 2017-01-05 LAB — CBC
HEMATOCRIT: 45.3 % (ref 38.5–50.0)
HEMOGLOBIN: 15.5 g/dL (ref 13.2–17.1)
MCH: 28.8 pg (ref 27.0–33.0)
MCHC: 34.2 g/dL (ref 32.0–36.0)
MCV: 84 fL (ref 80.0–100.0)
MPV: 10.4 fL (ref 7.5–12.5)
Platelets: 151 10*3/uL (ref 140–400)
RBC: 5.39 MIL/uL (ref 4.20–5.80)
RDW: 13.5 % (ref 11.0–15.0)
WBC: 5.7 10*3/uL (ref 3.8–10.8)

## 2017-01-05 LAB — COMPLETE METABOLIC PANEL WITH GFR
ALBUMIN: 4.5 g/dL (ref 3.6–5.1)
ALK PHOS: 37 U/L — AB (ref 40–115)
ALT: 37 U/L (ref 9–46)
AST: 19 U/L (ref 10–35)
BILIRUBIN TOTAL: 0.3 mg/dL (ref 0.2–1.2)
BUN: 19 mg/dL (ref 7–25)
CALCIUM: 9.5 mg/dL (ref 8.6–10.3)
CO2: 29 mmol/L (ref 20–31)
CREATININE: 1.05 mg/dL (ref 0.70–1.25)
Chloride: 103 mmol/L (ref 98–110)
GFR, EST AFRICAN AMERICAN: 89 mL/min (ref >=60)
GFR, EST NON AFRICAN AMERICAN: 77 mL/min (ref >=60)
Glucose, Bld: 136 mg/dL — ABNORMAL HIGH (ref 65–99)
Potassium: 4.7 mmol/L (ref 3.5–5.3)
Sodium: 139 mmol/L (ref 135–146)
TOTAL PROTEIN: 7.1 g/dL (ref 6.1–8.1)

## 2017-01-05 MED ORDER — NEOMYCIN-POLYMYXIN-HC 3.5-10000-1 OT SOLN: 3.0000 [drp] | Freq: Four times a day (QID) | OTIC | 0 refills | Status: DC

## 2017-01-05 NOTE — Progress Notes (Signed)
Derrick Blackwell is a 61 y.o. male who presents to [place]: [facility] today for fatigue and right ear fullness.   Patient notes a several day history of headache nausea and fatigue. He notes he has not been feeling well. He notes feeling vaguely swimmy headed and had a few episodes where his memory was a bit impaired. He denies any loss of function weakness or numbness. She denies vomiting diarrhea chest pain or palpitations. He tried taking some ibuprofen which helped a little. He feels well otherwise.  Right ear pressure: Patient has a history of right ear problems. Previously he had otitis externa. He feels pressure in his right ear for the last several days as well. He has not tried any treatment yet.   Past Medical History:  Diagnosis Date  . BPH (benign prostatic hyperplasia)   . Environmental allergies   . Hyperlipidemia   . Hypertension    pt denies, no meds  . Kidney infection    hospitalized as a child, no infections since  . Kidney stones   . Melanoma [facility])    Past Surgical History:  Procedure Laterality Date  . COLONOSCOPY    . LITHOTRIPSY     dry not in water 1990  . MELANOMA EXCISION     local only  . WISDOM TOOTH EXTRACTION     Social History  Substance Use Topics  . Smoking status: Current Some Day Smoker    Types: Cigars  . Smokeless tobacco: Never Used  . Alcohol use 0.5 oz/week    1 Standard drinks or equivalent per week   family history includes Breast cancer in his mother; Colon cancer (age of onset: 13) in his sister.  ROS as above:  Medications: Current Outpatient Prescriptions  Medication Sig Dispense Refill  . atorvastatin (LIPITOR) 10 MG tablet Take 1 tablet (10 mg total) by mouth daily. 90 tablet 3  . cetirizine (ZYRTEC) 10 MG tablet Take 10 mg by mouth daily.    Marland Kitchen doxazosin (CARDURA) 4 MG tablet Take 1 tablet (4 mg total) by  mouth daily. Due for follow up appointment. 90 tablet 0  . naproxen sodium (ANAPROX) 220 MG tablet Take 220 mg by mouth daily. 2 tabs once daily    . neomycin-polymyxin-hydrocortisone (CORTISPORIN) otic solution Place 3 drops into the right ear 4 (four) times daily. 10 mL 0   No current facility-administered medications for this visit.    Allergies  Allergen Reactions  . Penicillins     Health Maintenance Health Maintenance  Topic Date Due  . COLONOSCOPY  03/07/2016  . INFLUENZA VACCINE  04/23/2017 (Originally 05/27/2016)  . TETANUS/TDAP  04/24/2025  . Hepatitis C Screening  Completed  . HIV Screening  Completed     Exam:  BP (!) 170/87   Pulse 85   Wt 221 lb (100.2 kg)   SpO2 99%   BMI 31.71 kg/m  Gen: Well NAD HEENT: EOMI,  MMM Right ear occluded by cerumen left is normal. No carotid bruits are present Lungs: Normal work of breathing. CTABL Heart: RRR no MRG Abd: NABS, Soft. Nondistended, Nontender Exts: Brisk capillary refill, warm and well perfused.   12-lead EKG: Normal sinus rhythm at 82 bpm. No ST segment elevation or depression. Normal EKG.   Cerumen irrigated from right ear canal. Patient felt better. Ear canal is erythematous. Normal tympanic membrane.   No results found for this or any previous visit (from the past  72 hour(s)). No results found.    Assessment and Plan: 61 y.o. male with fatigue and vague symptoms. Unclear etiology. EKG is unremarkable. Blood pressure is a bit elevated. Check basic labs and recheck in 2 weeks. Return sooner if needed.  Right-sided cerumen impaction with otitis externa. Cerumen impaction relieved. Treat empirically with Cortisporin drops.   Orders Placed This Encounter  Procedures  . CBC  . COMPLETE METABOLIC PANEL WITH GFR   Meds ordered this encounter  Medications  . neomycin-polymyxin-hydrocortisone (CORTISPORIN) otic solution    Sig: Place 3 drops into the right ear 4 (four) times daily.    Dispense:  10 mL     Refill:  0     Discussed warning signs or symptoms. Please see discharge instructions. Patient expresses understanding.

## 2017-01-05 NOTE — Patient Instructions (Addendum)
Thank you for coming in today. Get labs today.  Recheck in 2 weeks.  Return fasting.  Call or go to the emergency room if you get worse, have trouble breathing, have chest pains, or palpitations.   Use the ear drops for pain and discomfort for a few days.

## 2017-01-19 ENCOUNTER — Ambulatory Visit (INDEPENDENT_AMBULATORY_CARE_PROVIDER_SITE_OTHER): Payer: 59 | Admitting: Family Medicine

## 2017-01-19 ENCOUNTER — Encounter: Payer: Self-pay | Admitting: Family Medicine

## 2017-01-19 VITALS — BP 148/84 | HR 75 | Wt 221.0 lb

## 2017-01-19 DIAGNOSIS — I10 Essential (primary) hypertension: Secondary | ICD-10-CM | POA: Diagnosis not present

## 2017-01-19 DIAGNOSIS — M25512 Pain in left shoulder: Secondary | ICD-10-CM

## 2017-01-19 DIAGNOSIS — R0683 Snoring: Secondary | ICD-10-CM

## 2017-01-19 MED ORDER — LISINOPRIL 10 MG PO TABS: 10.0000 mg | ORAL_TABLET | Freq: Every day | ORAL | 1 refills | Status: DC

## 2017-01-19 NOTE — Progress Notes (Signed)
Derrick Blackwell is a 61 y.o. male who presents to [place]: Derrick Blackwell today for fatigue with excessive daytime sleepiness, shoulder pain and follow-up on elevated blood pressure.    Patient is complaining of left shoulder pain and decreased mobility.  The pain is diffuse and feels stiff when arm is raised above the head or behind the back.  He has not tried much for shoulder pain.  He endorses frequent nighttime awakenings, snoring, difficultly breathing when lying on his back and daytime fatigue.  He reports his wife often hears him gasping at night.  He denies any difficulty falling asleep or waking up early.  He does not drink any sodas or coffee past noon and keeps regular bedtimes on weekends.    Past Medical History:  Diagnosis Date  . BPH (benign prostatic hyperplasia)   . Environmental allergies   . Hyperlipidemia   . Hypertension    pt denies, no meds  . Kidney infection    hospitalized as a child, no infections since  . Kidney stones   . Melanoma [facility])    Past Surgical History:  Procedure Laterality Date  . COLONOSCOPY    . LITHOTRIPSY     dry not in water 1990  . MELANOMA EXCISION     local only  . WISDOM TOOTH EXTRACTION     Social History  Substance Use Topics  . Smoking status: Current Some Day Smoker    Types: Cigars  . Smokeless tobacco: Never Used  . Alcohol use 0.5 oz/week    1 Standard drinks or equivalent per week   family history includes Breast cancer in his mother; Colon cancer (age of onset: 45) in his sister.  ROS as above:  Medications: Current Outpatient Prescriptions  Medication Sig Dispense Refill  . atorvastatin (LIPITOR) 10 MG tablet Take 1 tablet (10 mg total) by mouth daily. 90 tablet 3  . cetirizine (ZYRTEC) 10 MG tablet Take 10 mg by mouth daily.    Marland Kitchen doxazosin (CARDURA) 4 MG tablet Take 1 tablet (4 mg total) by mouth  daily. Due for follow up appointment. 90 tablet 0  . lisinopril (PRINIVIL,ZESTRIL) 10 MG tablet Take 1 tablet (10 mg total) by mouth daily. 30 tablet 1  . naproxen sodium (ANAPROX) 220 MG tablet Take 220 mg by mouth daily. 2 tabs once daily    . neomycin-polymyxin-hydrocortisone (CORTISPORIN) otic solution Place 3 drops into the right ear 4 (four) times daily. 10 mL 0   No current facility-administered medications for this visit.    Allergies  Allergen Reactions  . Penicillins     Health Maintenance Health Maintenance  Topic Date Due  . COLONOSCOPY  03/07/2016  . INFLUENZA VACCINE  04/23/2017 (Originally 05/27/2016)  . TETANUS/TDAP  04/24/2025  . Hepatitis C Screening  Completed  . HIV Screening  Completed     Exam:  BP (!) 148/84   Pulse 75   Wt 221 lb (100.2 kg)   BMI 31.71 kg/m  Gen: Well NAD HEENT: EOMI,  MMM Lungs: Normal work of breathing. CTABL Heart: RRR no MRG Abd: NABS, Soft. Nondistended, Nontender Exts: Brisk capillary refill, warm and well perfused MSK: Left shoulder:  Normal Appearing Non-tender ROM: Abduction: 100 deg active limited by pain. Normal passive. Ext Rot: Full Int Rot: to lumbar spine.  Post Hawkins Test. Positive Neers test.  Positive Empty can test   Strength intact throughout.     Procedure: Real-time Ultrasound  Guided Injection of left subacromial bursa  Device: GE Logiq E  Images permanently stored and available for review in the ultrasound unit. Verbal informed consent obtained. Discussed risks and benefits of procedure. Warned about infection bleeding damage to structures skin hypopigmentation and fat atrophy among others. Patient expresses understanding and agreement Time-out conducted.  Noted no overlying erythema, induration, or other signs of local infection.  Skin prepped in a sterile fashion.  Local anesthesia: Topical Ethyl chloride.  With sterile technique and under real time ultrasound guidance: 40mg  Kenalog and  3 mL Marcaine injected easily into the left subacromial bursa.  Completed without difficulty  Pain immediately resolved suggesting accurate placement of the medication.  Advised to call if fevers/chills, erythema, induration, drainage, or persistent bleeding.  Images permanently stored and available for review in the ultrasound unit.  Impression: Technically successful ultrasound guided injection into the left subacromial bursa with immediate pain relief after procedure.     No results found for this or any previous visit (from the past 72 hour(s)). No results found.    Assessment and Plan:  61 y.o. male with fatigue, left shoulder pain and elevated blood pressure.  Fatigue:  Derrick Blackwell is a 61 yo obese man who presents with excessive daytime sleepiness, fatigue, snoring, and constant nighttime awakenings.  His STOP-BANG sleep apnea questionnaire score of 6 indicates a high risk of OSA.  A home sleep study should be scheduled to evaluate for obstructive sleep apnea and follow up scheduled after the sleep study.  Left Shoulder Pain: Rotator cuff tendonitis is suspected.  Given his success in the right shoulder following steroid injection, 40 mg Kenalog and 3 mL Marcaine was injected into left subacromial bursa using ultrasound guidance.  Patient endorsed pain relief following procedure. Plan for Home exercises as well.   HTN:  Given his blood pressure has been elevated for the last several visit, patient will be started on 10 mg lisinopril and bp will be monitored.  May re-evaluated after sleep problems have been resolved.   Orders Placed This Encounter  Procedures  . Home sleep test    Standing Status:   Future    Standing Expiration Date:   01/19/2018    Scheduling Instructions:     [facility]    Order Specific Question:   Where should this test be performed:    Answer:   Other   Meds ordered this encounter  Medications  . lisinopril (PRINIVIL,ZESTRIL) 10 MG tablet     Sig: Take 1 tablet (10 mg total) by mouth daily.    Dispense:  30 tablet    Refill:  1     Discussed warning signs or symptoms. Please see discharge instructions. Patient expresses understanding.

## 2017-01-19 NOTE — Patient Instructions (Signed)
Thank you for coming in today. Recheck in 1 month.  You should hear form [facility].  Recheck in 1 month.   Call or go to the ER if you develop a large red swollen joint with extreme pain or oozing puss.    Shoulder Impingement Syndrome Rehab Ask your health care provider which exercises are safe for you. Do exercises exactly as told by your health care provider and adjust them as directed. It is normal to feel mild stretching, pulling, tightness, or discomfort as you do these exercises, but you should stop right away if you feel sudden pain or your pain gets worse.Do not begin these exercises until told by your health care provider. Stretching and range of motion exercise This exercise warms up your muscles and joints and improves the movement and flexibility of your shoulder. This exercise also helps to relieve pain and stiffness. Exercise A: Passive horizontal adduction   1. Sit or stand and pull your left / right elbow across your chest, toward your other shoulder. Stop when you feel a gentle stretch in the back of your shoulder and upper arm.  Keep your arm at shoulder height.  Keep your arm as close to your body as you comfortably can. 2. Hold for __________ seconds. 3. Slowly return to the starting position. Repeat __________ times. Complete this exercise __________ times a day. Strengthening exercises These exercises build strength and endurance in your shoulder. Endurance is the ability to use your muscles for a long time, even after they get tired. Exercise B: External rotation, isometric  1. Stand or sit in a doorway, facing the door frame. 2. Bend your left / right elbow and place the back of your wrist against the door frame. Only your wrist should be touching the frame. Keep your upper arm at your side. 3. Gently press your wrist against the door frame, as if you are trying to push your arm away from your abdomen.  Avoid shrugging your shoulder while you press  your hand against the door frame. Keep your shoulder blade tucked down toward the middle of your back. 4. Hold for __________ seconds. 5. Slowly release the tension, and relax your muscles completely before you do the exercise again. Repeat __________ times. Complete this exercise __________ times a day. Exercise C: Internal rotation, isometric   1. Stand or sit in a doorway, facing the door frame. 2. Bend your left / right elbow and place the inside of your wrist against the door frame. Only your wrist should be touching the frame. Keep your upper arm at your side. 3. Gently press your wrist against the door frame, as if you are trying to push your arm toward your abdomen.  Avoid shrugging your shoulder while you press your hand against the door frame. Keep your shoulder blade tucked down toward the middle of your back. 4. Hold for __________ seconds. 5. Slowly release the tension, and relax your muscles completely before you do the exercise again. Repeat __________ times. Complete this exercise __________ times a day. Exercise D: Scapular protraction, supine   1. Lie on your back on a firm surface. Hold a __________ weight in your left / right hand. 2. Raise your left / right arm straight into the air so your hand is directly above your shoulder joint. 3. Push the weight into the air so your shoulder lifts off of the surface that you are lying on. Do not move your head, neck, or back. 4. Hold for  __________ seconds. 5. Slowly return to the starting position. Let your muscles relax completely before you repeat this exercise. Repeat __________ times. Complete this exercise __________ times a day. Exercise E: Scapular retraction   1. Sit in a stable chair without armrests, or stand. 2. Secure an exercise band to a stable object in front of you so the band is at shoulder height. 3. Hold one end of the exercise band in each hand. Your palms should face down. 4. Squeeze your shoulder blades  together and move your elbows slightly behind you. Do not shrug your shoulders while you do this. 5. Hold for __________ seconds. 6. Slowly return to the starting position. Repeat __________ times. Complete this exercise __________ times a day. Exercise F: Shoulder extension   1. Sit in a stable chair without armrests, or stand. 2. Secure an exercise band to a stable object in front of you where the band is above shoulder height. 3. Hold one end of the exercise band in each hand. 4. Straighten your elbows and lift your hands up to shoulder height. 5. Squeeze your shoulder blades together and pull your hands down to the sides of your thighs. Stop when your hands are straight down by your sides. Do not let your hands go behind your body. 6. Hold for __________ seconds. 7. Slowly return to the starting position. Repeat __________ times. Complete this exercise __________ times a day. This information is not intended to replace advice given to you by your health care provider. Make sure you discuss any questions you have with your health care provider. Document Released: 10/13/2005 Document Revised: 06/19/2016 Document Reviewed: 09/15/2015 Elsevier Interactive Patient Education  2017 [employer].

## 2017-02-16 ENCOUNTER — Ambulatory Visit: Payer: 59 | Admitting: Family Medicine

## 2017-02-27 ENCOUNTER — Ambulatory Visit: Payer: 59 | Admitting: Family Medicine

## 2017-03-06 ENCOUNTER — Other Ambulatory Visit: Payer: Self-pay | Admitting: Family Medicine

## 2017-03-12 ENCOUNTER — Ambulatory Visit (INDEPENDENT_AMBULATORY_CARE_PROVIDER_SITE_OTHER): Payer: 59 | Admitting: Family Medicine

## 2017-03-12 ENCOUNTER — Ambulatory Visit (INDEPENDENT_AMBULATORY_CARE_PROVIDER_SITE_OTHER): Payer: 59

## 2017-03-12 VITALS — BP 149/97 | HR 90 | Temp 98.2°F | Wt 216.0 lb

## 2017-03-12 DIAGNOSIS — R0981 Nasal congestion: Secondary | ICD-10-CM

## 2017-03-12 DIAGNOSIS — M25512 Pain in left shoulder: Secondary | ICD-10-CM

## 2017-03-12 DIAGNOSIS — I10 Essential (primary) hypertension: Secondary | ICD-10-CM | POA: Diagnosis not present

## 2017-03-12 DIAGNOSIS — N23 Unspecified renal colic: Secondary | ICD-10-CM

## 2017-03-12 DIAGNOSIS — M7541 Impingement syndrome of right shoulder: Secondary | ICD-10-CM

## 2017-03-12 DIAGNOSIS — R7303 Prediabetes: Secondary | ICD-10-CM | POA: Diagnosis not present

## 2017-03-12 LAB — COMPLETE METABOLIC PANEL WITH GFR
ALBUMIN: 4.4 g/dL (ref 3.6–5.1)
ALK PHOS: 37 U/L — AB (ref 40–115)
ALT: 32 U/L (ref 9–46)
AST: 18 U/L (ref 10–35)
BUN: 14 mg/dL (ref 7–25)
CHLORIDE: 105 mmol/L (ref 98–110)
CO2: 21 mmol/L (ref 20–31)
CREATININE: 1.03 mg/dL (ref 0.70–1.25)
Calcium: 9.5 mg/dL (ref 8.6–10.3)
GFR, Est African American: >89 mL/min (ref >=60)
GFR, Est Non African American: 78 mL/min (ref >=60)
GLUCOSE: 118 mg/dL — AB (ref 65–99)
POTASSIUM: 4.1 mmol/L (ref 3.5–5.3)
SODIUM: 141 mmol/L (ref 135–146)
Total Bilirubin: 0.4 mg/dL (ref 0.2–1.2)
Total Protein: 6.7 g/dL (ref 6.1–8.1)

## 2017-03-12 LAB — CBC
HCT: 43.2 % (ref 38.5–50.0)
HEMOGLOBIN: 14.4 g/dL (ref 13.2–17.1)
MCH: 28.2 pg (ref 27.0–33.0)
MCHC: 33.3 g/dL (ref 32.0–36.0)
MCV: 84.5 fL (ref 80.0–100.0)
MPV: 10.1 fL (ref 7.5–12.5)
Platelets: 155 10*3/uL (ref 140–400)
RBC: 5.11 MIL/uL (ref 4.20–5.80)
RDW: 13.7 % (ref 11.0–15.0)
WBC: 5.9 10*3/uL (ref 3.8–10.8)

## 2017-03-12 LAB — POCT URINALYSIS DIPSTICK
Bilirubin, UA: NEGATIVE
Blood, UA: NEGATIVE
GLUCOSE UA: NEGATIVE
Ketones, UA: NEGATIVE
Leukocytes, UA: NEGATIVE
NITRITE UA: NEGATIVE
PROTEIN UA: NEGATIVE
SPEC GRAV UA: 1.02 (ref 1.010–1.025)
UROBILINOGEN UA: 0.2 U/dL
pH, UA: 6.5 (ref 5.0–8.0)

## 2017-03-12 MED ORDER — FLUTICASONE PROPIONATE 50 MCG/ACT NA SUSP: 2.0000 | Freq: Every day | NASAL | 2 refills | Status: DC

## 2017-03-12 MED ORDER — ZOSTER VAC RECOMB ADJUVANTED 50 MCG/0.5ML IM SUSR: 0.5000 mL | Freq: Once | INTRAMUSCULAR | 1 refills | Status: AC

## 2017-03-12 MED ORDER — MONTELUKAST SODIUM 10 MG PO TABS: 10.0000 mg | ORAL_TABLET | Freq: Every day | ORAL | 3 refills | Status: DC

## 2017-03-12 MED ORDER — DOXAZOSIN MESYLATE 4 MG PO TABS: ORAL_TABLET | ORAL | 3 refills | Status: DC

## 2017-03-12 NOTE — Progress Notes (Signed)
Pt is here for a 1 month follow up on medication.

## 2017-03-12 NOTE — Patient Instructions (Signed)
Thank you for coming in today. Get labs and xray today.  START PT for shoulder.  START flonase nasal spray.  If not better in 2 weeks start singulair.  Recheck following PT course if not better.    Secondary Shoulder Impingement Syndrome Shoulder impingement syndrome is a condition that causes pain when connective tissues (tendons) surrounding the shoulder joint become pinched. These tendons are part of the group of muscles and tissues that help to stabilize the shoulder (rotator cuff). There are two types of impingement syndrome: primary and secondary. Secondary impingement syndrome occurs when movement of the shoulder joint is abnormal. This can happen if there is too much movement (laxity), too little movement (stiffness), or abnormal movement. What are the causes? This condition may be caused by:  Shoulder blade muscles that are weak or uncoordinated (scapular dyskinesis).  Glenohumeral instability. This is too much movement of the upper arm bone (humerus). This can result from:  Having loose joints.  An injury that happened during repeated overhead arm movements, such as throwing.  A hard, direct hit (blow) to the shoulder. This is rare. What increases the risk? You may be more likely to develop this condition if you have injured your shoulder in the past or if you are an athlete who participates in:  Sports that involve throwing, such as baseball.  Tennis.  Swimming.  Volleyball. What are the signs or symptoms? The main symptom of this condition is pain on the front or side of the shoulder. Pain may:  Get worse when lifting or raising the arm.  Get worse at night.  Wake you up from sleeping.  Feel sharp when the shoulder is moved, and then fade to an ache. Other signs and symptoms may include:  Tenderness.  Stiffness.  Inability to raise the arm above shoulder level or behind the body.  Weakness. How is this diagnosed? This condition may be diagnosed based  on:  Your symptoms.  Your medical history.  A physical exam.  Imaging tests, such as:  X-rays.  MRI.  Ultrasound. How is this treated? Treatment for this condition may include:  Resting your shoulder and avoiding all activities that cause pain or put stress on the shoulder.  Icing your shoulder.  NSAIDs to help reduce pain and swelling.  One or more injections of medicines to numb the area and reduce inflammation.  Physical therapy.  Surgery. This may be needed if nonsurgical treatments do not help. Surgery may involve stabilizing your shoulder and repairing your rotator cuff, as needed. Follow these instructions at home: Managing pain, stiffness, and swelling   If directed, put ice on the injured area.  Put ice in a plastic bag.  Place a towel between your skin and the bag.  Leave the ice on for 20 minutes, 2-3 times a day. Activity   Rest and return to your normal activities as told by your health care provider. Ask your health care provider what activities are safe for you.  Do exercises as told by your health care provider. General instructions   Do not use any tobacco products, including cigarettes, chewing tobacco, or e-cigarettes. Tobacco can delay healing. If you need help quitting, ask your health care provider.  Ask your health care provider when it is safe for you to drive.  Take over-the-counter and prescription medicines only as told by your health care provider.  Keep all follow-up visits as told by your health care provider. This is important. How is this prevented?  Give your  body time to rest between periods of activity.  Maintain physical fitness, including strength in your shoulder muscles and back muscles. Contact a health care provider if:  Your symptoms have not improved after 2-3 months of treatment.  Your symptoms are getting worse. This information is not intended to replace advice given to you by your health care provider. Make  sure you discuss any questions you have with your health care provider. Document Released: 10/13/2005 Document Revised: 06/19/2016 Document Reviewed: 09/15/2015 Elsevier Interactive Patient Education  2017 [employer].

## 2017-03-12 NOTE — Progress Notes (Signed)
Derrick Blackwell is a 61 y.o. male who presents to [facility] today for follow-up nasal congestion, and shoulder pain, as well as back pain.  Nasal congestion: Patient has bothersome nasal congestion in the spring and in the fall interferes with sleep. He denies any fevers or chills. He takes Allegra but does not use any nasal steroids or other medications. He denies fevers or chills.  Additionally patient has bothersome shoulder pain. Last month he was seen for left shoulder pain and was given a subacromial bursa injection.  This resulted in immediate pain relief but only lasted for a few days. He notes bothersome shoulder pain with shoulder motion but especially at night. He denies any radiating pain weakness or numbness.  Back pain: Patient has mid low back pain on the left side present for about a month. He thinks it's been ongoing for about as long as he has been taking lisinopril. The pain is not consistent with previous episodes of kidney stones.   Past Medical History:  Diagnosis Date  . BPH (benign prostatic hyperplasia)   . Environmental allergies   . Hyperlipidemia   . Hypertension    pt denies, no meds  . Kidney infection    hospitalized as a child, no infections since  . Kidney stones   . Melanoma [facility])    Past Surgical History:  Procedure Laterality Date  . COLONOSCOPY    . LITHOTRIPSY     dry not in water 1990  . MELANOMA EXCISION     local only  . WISDOM TOOTH EXTRACTION     Social History  Substance Use Topics  . Smoking status: Current Some Day Smoker    Types: Cigars  . Smokeless tobacco: Never Used  . Alcohol use 0.5 oz/week    1 Standard drinks or equivalent per week   family history includes Breast cancer in his mother; Colon cancer (age of onset: 84) in his sister.  ROS as above:  Medications: Current Outpatient Prescriptions    Medication Sig Dispense Refill  . fexofenadine (ALLEGRA) 180 MG tablet Take 180 mg by mouth daily.    Marland Kitchen atorvastatin (LIPITOR) 10 MG tablet Take 1 tablet (10 mg total) by mouth daily. 90 tablet 3  . cetirizine (ZYRTEC) 10 MG tablet Take 10 mg by mouth daily.    Marland Kitchen doxazosin (CARDURA) 4 MG tablet TAKE 1 TABLET BY MOUTH ONCE DAILY. 90 tablet 3  . fluticasone (FLONASE) 50 MCG/ACT nasal spray Place 2 sprays into both nostrils daily. 16 g 2  . lisinopril (PRINIVIL,ZESTRIL) 10 MG tablet Take 1 tablet (10 mg total) by mouth daily. 30 tablet 1  . montelukast (SINGULAIR) 10 MG tablet Take 1 tablet (10 mg total) by mouth at bedtime. 30 tablet 3  . naproxen sodium (ANAPROX) 220 MG tablet Take 220 mg by mouth daily. 2 tabs once daily    . neomycin-polymyxin-hydrocortisone (CORTISPORIN) otic solution Place 3 drops into the right ear 4 (four) times daily. (Patient not taking: Reported on 03/12/2017) 10 mL 0  . Zoster Vac Recomb Adjuvanted [facility]) injection Inject 0.5 mLs into the muscle once. Give again after 2 months. If administered in clinic please fax report to Dr Georgina Snell [phone] 1 each 1   No current facility-administered medications for this visit.    Allergies  Allergen Reactions  . Penicillins     Health Maintenance Health Maintenance  Topic Date Due  . COLONOSCOPY  03/07/2016  .  INFLUENZA VACCINE  05/27/2017  . TETANUS/TDAP  04/24/2025  . Hepatitis C Screening  Completed  . HIV Screening  Completed     Exam:  BP (!) 149/97 (BP Location: Left Arm, Patient Position: Sitting, Cuff Size: Normal)   Pulse 90   Temp 98.2 F (36.8 C) (Oral)   Wt 216 lb (98 kg)   SpO2 100%   BMI 30.99 kg/m  Gen: Well NAD HEENT: EOMI,  MMM Inflamed nasal turbinates with clear nasal discharge bilaterally. Mild postnasal drainage with cobblestoning. Normal tympanic membranes. No cervical lymphadenopathy. Lungs: Normal work of breathing. CTABL Heart: RRR no MRG Abd: NABS, Soft. Nondistended,  Nontender Exts: Brisk capillary refill, warm and well perfused.  MSK: Left shoulder normal-appearing. Mildly tender to palpation overlying the acromioclavicular joint. Normal motion. Positive Hawkins and Neer's test. Negative empty can test. Since and speeds test. Positive O'Brien's test. Positive crossover arm compression test. Strength is intact throughout.  Back: Nontender to spinal midline. Mildly tender to palpation left lumbar paraspinal muscles. No CVA angle tenderness to percussion   Results for orders placed or performed in visit on 03/12/17 (from the past 72 hour(s))  POCT Urinalysis Dipstick     Status: Normal   Collection Time: 03/12/17  8:26 AM  Result Value Ref Range   Color, UA yellow    Clarity, UA Clear    Glucose, UA N    Bilirubin, UA N    Ketones, UA N    Spec Grav, UA 1.020 1.010 - 1.025   Blood, UA N    pH, UA 6.5 5.0 - 8.0   Protein, UA N    Urobilinogen, UA 0.2 0.2 or 1.0 E.U./dL   Nitrite, UA N    Leukocytes, UA Negative Negative   Dg Shoulder Left  Result Date: 03/12/2017 CLINICAL DATA:  Acute left shoulder pain for the past 2 months with no known injury. EXAM: LEFT SHOULDER - 2+ VIEW COMPARISON:  None in PACs FINDINGS: The bones are subjectively adequately mineralized. The glenohumeral joint space is well maintained. The subacromial subdeltoid space appears normal. There is mild narrowing of the AC joint. The observed portions of the left clavicle and upper left ribs are normal. IMPRESSION: There is no acute bony abnormality of the left shoulder. There is mild AC joint degenerative change. Electronically Signed   By: David  Martinique M.D.   On: 03/12/2017 09:22      Assessment and Plan: 61 y.o. male with  Nasal congestion interfering with sleep likely seasonal allergies. Continue Allegra. Add Flonase nasal spray if not better fill Singulair prescription.  Left shoulder pain: Unclear etiology. Patient had considerable improvement with subacromial bursa  injection last month however this only lasted for a few days. Plan for trial of physical therapy. If not better will proceed with MRI.  Low back pain: Unclear etiology. Plan for Limited laboratory workup. If not better we'll do a trial off of lisinopril and atorvastatin serially.  Shingles vaccine prescribed   Orders Placed This Encounter  Procedures  . DG Shoulder Left    Standing Status:   Future    Number of Occurrences:   1    Standing Expiration Date:   05/12/2018    Order Specific Question:   Reason for Exam (SYMPTOM  OR DIAGNOSIS REQUIRED)    Answer:   left shoulder pain    Order Specific Question:   Preferred imaging location?    Answer:   [facility]    Order Specific Question:   Radiology  Contrast Protocol - do NOT remove file path    Answer:   [email or web address]  . CBC  . COMPLETE METABOLIC PANEL WITH GFR  . CK  . PSA  . Ambulatory referral to Physical Therapy    Referral Priority:   Routine    Referral Type:   Physical Medicine    Referral Reason:   Specialty Services Required    Requested Specialty:   Physical Therapy    Number of Visits Requested:   1  . POCT Urinalysis Dipstick   Meds ordered this encounter  Medications  . fexofenadine (ALLEGRA) 180 MG tablet    Sig: Take 180 mg by mouth daily.  . fluticasone (FLONASE) 50 MCG/ACT nasal spray    Sig: Place 2 sprays into both nostrils daily.    Dispense:  16 g    Refill:  2  . montelukast (SINGULAIR) 10 MG tablet    Sig: Take 1 tablet (10 mg total) by mouth at bedtime.    Dispense:  30 tablet    Refill:  3  . doxazosin (CARDURA) 4 MG tablet    Sig: TAKE 1 TABLET BY MOUTH ONCE DAILY.    Dispense:  90 tablet    Refill:  3  . Zoster Vac Recomb Adjuvanted [facility]) injection    Sig: Inject 0.5 mLs into the muscle once. Give again after 2 months. If administered in clinic please fax report to Dr Georgina Snell [phone]    Dispense:  1 each    Refill:  1      Discussed warning signs or symptoms. Please see discharge instructions. Patient expresses understanding.

## 2017-03-13 LAB — PSA: PSA: 0.8 ng/mL (ref <=4.0)

## 2017-03-13 LAB — CK: Total CK: 69 U/L (ref 44–196)

## 2017-03-25 ENCOUNTER — Ambulatory Visit (INDEPENDENT_AMBULATORY_CARE_PROVIDER_SITE_OTHER): Payer: 59 | Admitting: Physical Therapy

## 2017-03-25 ENCOUNTER — Encounter: Payer: Self-pay | Admitting: Physical Therapy

## 2017-03-25 DIAGNOSIS — M6281 Muscle weakness (generalized): Secondary | ICD-10-CM | POA: Diagnosis not present

## 2017-03-25 DIAGNOSIS — M62838 Other muscle spasm: Secondary | ICD-10-CM | POA: Diagnosis not present

## 2017-03-25 DIAGNOSIS — R293 Abnormal posture: Secondary | ICD-10-CM

## 2017-03-25 DIAGNOSIS — M25511 Pain in right shoulder: Secondary | ICD-10-CM | POA: Diagnosis not present

## 2017-03-25 DIAGNOSIS — M25512 Pain in left shoulder: Secondary | ICD-10-CM

## 2017-03-25 NOTE — Therapy (Signed)
[name], [place], [address] Phone: [phone]   Fax:  [phone]  Physical Therapy Evaluation  Patient Details  Name: Derrick Blackwell MRN: [id number] Date of Birth: [birth year] Referring Provider: Dr Steva Colder  Encounter Date: 03/25/2017      PT End of Session - 03/25/17 0804    Visit Number 1   Number of Visits 8   Date for PT Re-Evaluation 04/22/17   PT Start Time 0805   PT Stop Time 0850   PT Time Calculation (min) 45 min   Activity Tolerance Patient tolerated treatment well      Past Medical History:  Diagnosis Date  . BPH (benign prostatic hyperplasia)   . Environmental allergies   . Hyperlipidemia   . Hypertension    pt denies, no meds  . Kidney infection    hospitalized as a child, no infections since  . Kidney stones   . Melanoma [facility])     Past Surgical History:  Procedure Laterality Date  . COLONOSCOPY    . LITHOTRIPSY     dry not in water 1990  . MELANOMA EXCISION     local only  . WISDOM TOOTH EXTRACTION      There were no vitals filed for this visit.       Subjective Assessment - 03/25/17 0805    Subjective Pt reports he has pain in both shoulders, the Lt worse, This all started about 4-5 months ago.  Feels like he is restricted in his motion. Wakes him with sleeping both sides. Tolerates about 20 min before having to move.    Diagnostic tests x-rays - bone spur on rt, arthritis on Lt.    Patient Stated Goals get dressed without pain, sleep better and have improved flexibility.   Currently in Pain? Yes   Pain Score 1    Pain Location Shoulder   Pain Orientation Left   Pain Descriptors / Indicators Dull   Pain Type Acute pain   Pain Onset More than a month ago   Pain Frequency Intermittent   Aggravating Factors  reaching behind, sleeping on the sides   Pain Relieving Factors relax and rest the shoulder.             [facility] PT Assessment - 03/25/17 0001       Assessment   Medical Diagnosis Lt shoulder pain   Referring Provider Dr Steva Colder   Onset Date/Surgical Date 10/25/16   Hand Dominance Right   Next MD Visit PRN   Prior Therapy none     Precautions   Precautions None     Balance Screen   Has the patient fallen in the past 6 months Yes   How many times? 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[employer], desk work   Leisure working in yard , going to [employer]     Observation/Other Assessments   Focus on Therapeutic Outcomes (FOTO)  45% limited     Posture/Postural Control   Posture/Postural Control Postural limitations   Postural Limitations Forward head;Rounded Shoulders  elevated Rt shoulder complex     ROM / Strength   AROM / PROM / Strength AROM;Strength     AROM   AROM Assessment Site Shoulder;Cervical   Right/Left Shoulder Left;Right  pain endrange, limited abd bilat ~  90 degrees, behind to SIJ   Right Shoulder External Rotation 62 Degrees   Left Shoulder External Rotation 43 Degrees   Cervical Flexion WNL   Cervical Extension 30   Cervical - Right Rotation 50   Cervical - Left Rotation 60     Strength   Overall Strength Comments mid traps 4-/5 , unable to asses low traps d/t pain in position.    Strength Assessment Site Shoulder;Elbow   Right/Left Shoulder --  bilat WNL   Right/Left Elbow --  bilat WNL      Palpation   Palpation comment tightness in Rt upper trap/levator, trigger point in bilat proximal biceps, Lt > Rt and with more pain     Special Tests    Special Tests Rotator Cuff Impingement  (-) spurlings bilat, (+) upper neurontightness.    Rotator Cuff Impingment tests Michel Bickers test     Hawkins-Kennedy test   Findings Positive   Side Left            Objective measurements completed on examination: See above findings.          [facility]  PT Treatment/Exercise - 03/25/17 0001      Self-Care   Self-Care Posture   Posture cervical and scapular retraction, shoulder and cervical mechanics.      Exercises   Exercises Shoulder     Shoulder Exercises: Standing   External Rotation Strengthening;Both;10 reps;Theraband  with towel under elbow   Theraband Level (Shoulder External Rotation) Level 2 (Red)   Retraction Strengthening;Both;10 reps;Theraband  bilat with slight ER    Theraband Level (Shoulder Retraction) Level 2 (Red)     Shoulder Exercises: Stretch   Other Shoulder Stretches medial nerve root stretches bilat.                      PT Long Term Goals - 03/25/17 1008      PT LONG TERM GOAL #1   Title I with advanced HEP ( 04/22/17)    Time 4   Period Weeks   Status New     PT LONG TERM GOAL #2   Title report =/> 75% reduction in bilat shoulder pain to allow him to sleep per his previous level ( 04/22/17)    Time 4   Period Weeks   Status New     PT LONG TERM GOAL #3   Title increase bilat shoulder ER to [facility] to allow him to donn his button up shirts without pain ( 04/22/17)    Time 4   Period Weeks   Status New     PT LONG TERM GOAL #4   Title increase mid trap strength =/> 4+/5 ( 04/22/17)    Time 4   Period Weeks   Status New     PT LONG TERM GOAL #5   Title improve bilat cervical rotation =/< 60 degrees ( 04/22/17)    Time 4   Period Weeks   Status New     Additional Long Term Goals   Additional Long Term Goals Yes     PT LONG TERM GOAL #6   Title improve FOTO =/> 30% limited ( 04/22/17)    Time 4   Period Weeks   Status New                Plan - 03/25/17 1003    Clinical Impression Statement 61 yo male presents for moderat complexity PT eval.  He has symptoms of both upper neuron  restrictions and shoulder impingement.  Has postural changes, upper back weakness, pain in shoulders, decreased cervical motion and trigger point tightness.  He has attempted to modifiy his work  station to use a standing desk, while this helps his shoulders it increases his low back pain.    Clinical Presentation Evolving   Clinical Decision Making Moderate   PT Frequency 2x / week   PT Duration 4 weeks   PT Treatment/Interventions Moist Heat;Ultrasound;Therapeutic exercise;Dry needling;Manual techniques;Neuromuscular re-education;Cryotherapy;Electrical Stimulation;Iontophoresis 4mg /ml Dexamethasone;Patient/family education;Passive range of motion   PT Next Visit Plan manual work to upper traps, shoulder complex, scapular stabilization ex and postural re-ed.    Consulted and Agree with Plan of Care Patient      Patient will benefit from skilled therapeutic intervention in order to improve the following deficits and impairments:  Postural dysfunction, Pain, Decreased strength, Impaired UE functional use, Increased muscle spasms, Decreased range of motion  Visit Diagnosis: Acute pain of both shoulders - Plan: PT plan of care cert/re-cert  Muscle weakness (generalized) - Plan: PT plan of care cert/re-cert  Abnormal posture - Plan: PT plan of care cert/re-cert  Other muscle spasm - Plan: PT plan of care cert/re-cert     Problem List Patient Active Problem List   Diagnosis Date Noted  . Nasal congestion 03/12/2017  . Left shoulder pain 01/19/2017  . Snores 01/19/2017  . Bleeding from right ear 06/19/2016  . Prediabetes 05/30/2016  . Family history of colon cancer 05/28/2016  . Hypertension 03/15/2014  . Hyperlipidemia 03/15/2014  . History of melanoma 03/15/2014  . BPH (benign prostatic hyperplasia) 03/15/2014  . Impingement syndrome of right shoulder 03/15/2014    Jeral Pinch PT  03/25/2017, 10:12 AM  [facility] [place], [place], [address] Phone: [phone]   Fax:  [phone]  Name: Derrick Blackwell MRN: [id number] Date of Birth: Nov 10, 1955

## 2017-03-25 NOTE — Patient Instructions (Addendum)
Flexibility: Neck Retraction    Pull head straight back, keeping eyes and jaw level. Repeat _5-10___ times per set. Do _multiple___ sessions per day. ( every 45 minutes)   Scapular Retraction (Standing)    With arms at sides, pinch shoulder blades together. Repeat __5-10__ times per set. Do _multiple__ sessions per day. ( every 45 minutes)    Pelvic Tilt    Flatten back by tightening stomach muscles. Hold 5-10 sec. Repeat __10__ times per set. Do __1-2__ sets per session. Do __1__ sessions per day.  Upper Limb Neural Tension: Median III    Stand with left palm flat on wall, fingers back. Bend elbow, side-bend head away and Hold __5__ seconds. Then straighten elbow and Hold __5__ seconds. Repeat __5-10__ times per set. Do __1-2__ sets per session. Do _1-2___ sessions per day. Repeat on the other side.   Strengthening: Resisted External Rotation - towel under elbow, repeat on the other side. Keep chest lifted    Hold tubing in right hand, elbow at side and forearm across body. Rotate forearm out. Repeat _10___ times per set. Do __3__ sets per session. Do __1__ sessions per day.  Resisted External Rotation: in Neutral - Bilateral    Sit or stand, tubing in both hands, elbows at sides, bent to 90, forearms forward. Pinch shoulder blades together and rotate forearms out. Keep elbows at sides. Repeat __10__ times per set. Do __3__ sets per session. Do ___1_ sessions per day.

## 2017-03-30 ENCOUNTER — Ambulatory Visit (INDEPENDENT_AMBULATORY_CARE_PROVIDER_SITE_OTHER): Payer: 59 | Admitting: Physical Therapy

## 2017-03-30 DIAGNOSIS — R293 Abnormal posture: Secondary | ICD-10-CM | POA: Diagnosis not present

## 2017-03-30 DIAGNOSIS — M6281 Muscle weakness (generalized): Secondary | ICD-10-CM

## 2017-03-30 DIAGNOSIS — M62838 Other muscle spasm: Secondary | ICD-10-CM

## 2017-03-30 DIAGNOSIS — M25511 Pain in right shoulder: Secondary | ICD-10-CM

## 2017-03-30 DIAGNOSIS — M25512 Pain in left shoulder: Secondary | ICD-10-CM | POA: Diagnosis not present

## 2017-03-30 NOTE — Therapy (Signed)
[place], [place], [address] Phone: [phone]   Fax:  [phone]  Physical Therapy Treatment  Patient Details  Name: Derrick Blackwell MRN: [id number] Date of Birth: January 23, 1956 Referring Provider: Dr. Georgina Snell  Encounter Date: 03/30/2017      PT End of Session - 03/30/17 0806    Visit Number 2   Number of Visits 8   Date for PT Re-Evaluation 04/22/17   PT Start Time 0801   PT Stop Time 0923   PT Time Calculation (min) 46 min   Activity Tolerance Patient tolerated treatment well   Behavior During Therapy [facility] for tasks assessed/performed      Past Medical History:  Diagnosis Date  . BPH (benign prostatic hyperplasia)   . Environmental allergies   . Hyperlipidemia   . Hypertension    pt denies, no meds  . Kidney infection    hospitalized as a child, no infections since  . Kidney stones   . Melanoma [facility])     Past Surgical History:  Procedure Laterality Date  . COLONOSCOPY    . LITHOTRIPSY     dry not in water 1990  . MELANOMA EXCISION     local only  . WISDOM TOOTH EXTRACTION      There were no vitals filed for this visit.      Subjective Assessment - 03/30/17 0806    Subjective Pt reports he is unsure if he is doing the exercises of HEP correctly.  He noticed improvement on days he did do the exercises. He continues to wake in the night every 30 min to hour due to pain in shoulders.           [facility] PT Assessment - 03/30/17 0001      Assessment   Medical Diagnosis Lt shoulder pain   Referring Provider Dr. Georgina Snell   Onset Date/Surgical Date 10/25/16         [facility] Adult PT Treatment/Exercise - 03/30/17 0001      Exercises   Exercises Neck;Shoulder;Knee/Hip     Neck Exercises: Standing   Neck Retraction 10 reps  back against pool noodle   Neck Retraction Limitations pt reported increased LBP   Other Standing Exercises scap retraction with back against pool noodle x 5 x  10 reps      Neck Exercises: Supine   Neck Retraction 5 reps   Other Supine Exercise Trial of snow angels; unable to tolerate this motion past ~80 deg due to increased pain in Lt shoulder.      Knee/Hip Exercises: Stretches   Passive Hamstring Stretch Right;Left;2 reps;30 seconds   Other Knee/Hip Stretches Lower trunk rotation x 2 reps x 10 sec.  single knee to chest x 2 reps x 15 sec each side.      Shoulder Exercises: Supine   Other Supine Exercises shoulder ER with red band x 10 reps      Shoulder Exercises: Standing   External Rotation Strengthening;Both;10 reps;Theraband   Theraband Level (Shoulder External Rotation) Level 2 (Red)   Retraction Both;10 reps  5 sec   Other Standing Exercises Lt median nerve stretch with Lt hand on wall, head lat flexion Rt x 10 sec x 5 rep,      Shoulder Exercises: ROM/Strengthening   UBE (Upper Arm Bike) L1: 1 min each direction.      Shoulder Exercises: Stretch   Other Shoulder Stretches medial nerve root stretches x 10 reps LUE, 5 reps RLE (  hand flat on wall, tilting head away)     Modalities   Modalities --  pt declined     Neck Exercises: Stretches   Other Neck Stretches Doorway stretch mid level x 3 reps x 30 sec.                 PT Education - 03/30/17 0834    Education provided Yes   Education Details HEP -added doorway stretch, hamstring    Person(s) Educated Patient   Methods Explanation;Demonstration;Handout;Verbal cues   Comprehension Verbalized understanding;Returned demonstration             PT Long Term Goals - 03/30/17 1710      PT LONG TERM GOAL #1   Title I with advanced HEP ( 04/22/17)    Time 4   Period Weeks   Status On-going     PT LONG TERM GOAL #2   Title report =/> 75% reduction in bilat shoulder pain to allow him to sleep per his previous level ( 04/22/17)    Time 4   Period Weeks   Status On-going     PT LONG TERM GOAL #3   Title increase bilat shoulder ER to [facility] to allow him to donn  his button up shirts without pain ( 04/22/17)    Time 4   Period Weeks   Status On-going     PT LONG TERM GOAL #4   Title increase mid trap strength =/> 4+/5 ( 04/22/17)    Time 4   Period Weeks   Status On-going     PT LONG TERM GOAL #5   Title improve bilat cervical rotation =/< 60 degrees ( 04/22/17)    Time 4   Period Weeks   Status On-going     PT LONG TERM GOAL #6   Title improve FOTO =/> 30% limited ( 04/22/17)    Time 4   Period Weeks   Status On-going               Plan - 03/30/17 1458    Clinical Impression Statement Pt reported decreased LUE tightness after neural stretch.  He complained of low back discomfort with standing neck/shoulder exercises.  This discomfort was slightly reduced with LE and low back stretches.  Pt reported minimal to no pain at end of session.  Progressing towards goals.    PT Frequency 2x / week   PT Duration 4 weeks   PT Treatment/Interventions Moist Heat;Ultrasound;Therapeutic exercise;Dry needling;Manual techniques;Neuromuscular re-education;Cryotherapy;Electrical Stimulation;Iontophoresis 4mg /ml Dexamethasone;Patient/family education;Passive range of motion   PT Next Visit Plan manual work to upper traps, shoulder complex, scapular stabilization ex and postural re-ed.    Consulted and Agree with Plan of Care Patient      Patient will benefit from skilled therapeutic intervention in order to improve the following deficits and impairments:  Postural dysfunction, Pain, Decreased strength, Impaired UE functional use, Increased muscle spasms, Decreased range of motion  Visit Diagnosis: Acute pain of both shoulders  Muscle weakness (generalized)  Abnormal posture  Other muscle spasm     Problem List Patient Active Problem List   Diagnosis Date Noted  . Nasal congestion 03/12/2017  . Left shoulder pain 01/19/2017  . Snores 01/19/2017  . Bleeding from right ear 06/19/2016  . Prediabetes 05/30/2016  . Family history of colon  cancer 05/28/2016  . Hypertension 03/15/2014  . Hyperlipidemia 03/15/2014  . History of melanoma 03/15/2014  . BPH (benign prostatic hyperplasia) 03/15/2014  . Impingement syndrome of right shoulder 03/15/2014  Kerin Perna, PTA 03/30/17 5:12 PM  [place], [place], [address] Phone: [phone]   Fax:  [phone]  Name: Coltrane Tugwell MRN: [id number] Date of Birth: 1956-01-29

## 2017-03-30 NOTE — Patient Instructions (Addendum)
  Scapula Adduction With Pectorals, Low   Stand in doorframe with palms against frame and arms at 45. Lean forward and squeeze shoulder blades. Hold _30__ seconds. Repeat _2__ times per session. Do __2_ sessions per day.

## 2017-04-02 ENCOUNTER — Ambulatory Visit (INDEPENDENT_AMBULATORY_CARE_PROVIDER_SITE_OTHER): Payer: 59 | Admitting: Physical Therapy

## 2017-04-02 DIAGNOSIS — M25511 Pain in right shoulder: Secondary | ICD-10-CM

## 2017-04-02 DIAGNOSIS — R293 Abnormal posture: Secondary | ICD-10-CM

## 2017-04-02 DIAGNOSIS — M25512 Pain in left shoulder: Secondary | ICD-10-CM

## 2017-04-02 DIAGNOSIS — M62838 Other muscle spasm: Secondary | ICD-10-CM

## 2017-04-02 DIAGNOSIS — M6281 Muscle weakness (generalized): Secondary | ICD-10-CM | POA: Diagnosis not present

## 2017-04-02 NOTE — Therapy (Signed)
[place], [place], [address] Phone: [phone]   Fax:  [phone]  Physical Therapy Treatment  Patient Details  Name: Derrick Blackwell MRN: [id number] Date of Birth: 05/15/1956 Referring Provider: Dr. Georgina Snell  Encounter Date: 04/02/2017      PT End of Session - 04/02/17 1642    Visit Number 3   Number of Visits 8   Date for PT Re-Evaluation 04/22/17   PT Start Time 1618   PT Stop Time 1701   PT Time Calculation (min) 43 min   Activity Tolerance Patient tolerated treatment well      Past Medical History:  Diagnosis Date  . BPH (benign prostatic hyperplasia)   . Environmental allergies   . Hyperlipidemia   . Hypertension    pt denies, no meds  . Kidney infection    hospitalized as a child, no infections since  . Kidney stones   . Melanoma [facility])     Past Surgical History:  Procedure Laterality Date  . COLONOSCOPY    . LITHOTRIPSY     dry not in water 1990  . MELANOMA EXCISION     local only  . WISDOM TOOTH EXTRACTION      There were no vitals filed for this visit.      Subjective Assessment - 04/02/17 1642    Subjective Pt reports he is sleeping a little bit better.  He woke in the night 2 days ago, 5/10, kept him awake for 45 min. He's not sure why that happened.  He has been doing exercises daily.     Patient Stated Goals get dressed without pain, sleep better and have improved flexibility.   Currently in Pain? Yes   Pain Score 1    Pain Location Shoulder   Pain Orientation Left   Pain Descriptors / Indicators Dull   Aggravating Factors  reaching behind back, sleeping on side   Pain Relieving Factors stretches, and resting            [facility] PT Assessment - 04/02/17 0001      Assessment   Medical Diagnosis Lt shoulder pain   Referring Provider Dr. Georgina Snell   Onset Date/Surgical Date 10/25/16   Hand Dominance Right   Next MD Visit PRN   Prior Therapy none           [facility] Adult PT Treatment/Exercise - 04/02/17 0001      Self-Care   Self-Care Other Self-Care Comments   Other Self-Care Comments  Pt educated on self massage with ball to neck/uppertrap/ back musculature to decrease pain and tightness.  Pt verbalized understanding and returned demo.      Shoulder Exercises: Supine   Other Supine Exercises shoulder ER with red band x 10 reps, 2 sets    Other Supine Exercises Hooklying on 1/2 foam roller:  sustained stretch with arms abdct to 90 deg x 2 min, then 5 snow angels to tolerance; bilat shoulder flexion (overhead pull) with red band x 12 reps with red band; horiz abd with red band x 10.  D2 sash with red band x 12 reps each arm.       Shoulder Exercises: ROM/Strengthening   UBE (Upper Arm Bike) L1: 1 min each direction.      Shoulder Exercises: Stretch   Other Shoulder Stretches medial nerve root stretches x 4 reps LUE, (hand flat on wall, tilting head away)   Other Shoulder Stretches doorway stretch, mid position 4 x, trial  of high and low position, increased symptoms despite modifications.      Modalities   Modalities --  pt declined.      Manual Therapy   Manual Therapy Myofascial release;Soft tissue mobilization   Soft tissue mobilization STM to bilat upper trap, thoracic paraspinals, scalenes and levator.     Myofascial Release suboccipital release                     PT Long Term Goals - 03/30/17 1710      PT LONG TERM GOAL #1   Title I with advanced HEP ( 04/22/17)    Time 4   Period Weeks   Status On-going     PT LONG TERM GOAL #2   Title report =/> 75% reduction in bilat shoulder pain to allow him to sleep per his previous level ( 04/22/17)    Time 4   Period Weeks   Status On-going     PT LONG TERM GOAL #3   Title increase bilat shoulder ER to [facility] to allow him to donn his button up shirts without pain ( 04/22/17)    Time 4   Period Weeks   Status On-going     PT LONG TERM GOAL #4   Title increase mid trap  strength =/> 4+/5 ( 04/22/17)    Time 4   Period Weeks   Status On-going     PT LONG TERM GOAL #5   Title improve bilat cervical rotation =/< 60 degrees ( 04/22/17)    Time 4   Period Weeks   Status On-going     PT LONG TERM GOAL #6   Title improve FOTO =/> 30% limited ( 04/22/17)    Time 4   Period Weeks   Status On-going               Plan - 04/02/17 1802    Clinical Impression Statement Pt reporting slight decrease in symptoms today. He reports he notices neural stretches are intense the 1st rep, but barely noticeable by 3rd rep.  Pt tolerated all exercises well without increase symptoms, except high arm position of doorway stretch.  Pt progressing towards goals.    PT Frequency 2x / week   PT Duration 4 weeks   PT Treatment/Interventions Moist Heat;Ultrasound;Therapeutic exercise;Dry needling;Manual techniques;Neuromuscular re-education;Cryotherapy;Electrical Stimulation;Iontophoresis 4mg /ml Dexamethasone;Patient/family education;Passive range of motion   PT Next Visit Plan continuue scapular stabilization.  trial of MHP and estim, if indicated.    Consulted and Agree with Plan of Care Patient      Patient will benefit from skilled therapeutic intervention in order to improve the following deficits and impairments:  Postural dysfunction, Pain, Decreased strength, Impaired UE functional use, Increased muscle spasms, Decreased range of motion  Visit Diagnosis: Acute pain of both shoulders  Muscle weakness (generalized)  Abnormal posture  Other muscle spasm     Problem List Patient Active Problem List   Diagnosis Date Noted  . Nasal congestion 03/12/2017  . Left shoulder pain 01/19/2017  . Snores 01/19/2017  . Bleeding from right ear 06/19/2016  . Prediabetes 05/30/2016  . Family history of colon cancer 05/28/2016  . Hypertension 03/15/2014  . Hyperlipidemia 03/15/2014  . History of melanoma 03/15/2014  . BPH (benign prostatic hyperplasia) 03/15/2014  .  Impingement syndrome of right shoulder 03/15/2014   Kerin Perna, PTA 04/02/17 6:07 PM  [place], [place], [address] Phone: [phone]   Fax:  [phone]  Name: Derrick Blackwell MRN: [id number] Date of Birth: 08/11/1956

## 2017-04-06 ENCOUNTER — Ambulatory Visit (INDEPENDENT_AMBULATORY_CARE_PROVIDER_SITE_OTHER): Payer: 59 | Admitting: Physical Therapy

## 2017-04-06 ENCOUNTER — Encounter: Payer: Self-pay | Admitting: Physical Therapy

## 2017-04-06 DIAGNOSIS — M25512 Pain in left shoulder: Secondary | ICD-10-CM | POA: Diagnosis not present

## 2017-04-06 DIAGNOSIS — M62838 Other muscle spasm: Secondary | ICD-10-CM | POA: Diagnosis not present

## 2017-04-06 DIAGNOSIS — M6281 Muscle weakness (generalized): Secondary | ICD-10-CM

## 2017-04-06 DIAGNOSIS — R293 Abnormal posture: Secondary | ICD-10-CM

## 2017-04-06 DIAGNOSIS — M25511 Pain in right shoulder: Secondary | ICD-10-CM | POA: Diagnosis not present

## 2017-04-06 NOTE — Therapy (Signed)
[place], [place], [address] Phone: [phone]   Fax:  [phone]  Physical Therapy Treatment  Patient Details  Name: Derrick Blackwell MRN: [id number] Date of Birth: 1956/04/24 Referring Provider: Dr. Georgina Snell  Encounter Date: 04/06/2017      PT End of Session - 04/06/17 0756    Visit Number 4   Number of Visits 8   Date for PT Re-Evaluation 04/22/17   PT Start Time 0758   PT Stop Time 0844   PT Time Calculation (min) 46 min   Activity Tolerance Patient tolerated treatment well      Past Medical History:  Diagnosis Date  . BPH (benign prostatic hyperplasia)   . Environmental allergies   . Hyperlipidemia   . Hypertension    pt denies, no meds  . Kidney infection    hospitalized as a child, no infections since  . Kidney stones   . Melanoma [facility])     Past Surgical History:  Procedure Laterality Date  . COLONOSCOPY    . LITHOTRIPSY     dry not in water 1990  . MELANOMA EXCISION     local only  . WISDOM TOOTH EXTRACTION      There were no vitals filed for this visit.      Subjective Assessment - 04/06/17 0802    Subjective Pt doesn't notice much difference, still having pain with sleeping.  Using the ball for trigger point release. Was very sore the first time and not as bad after.    Patient Stated Goals get dressed without pain, sleep better and have improved flexibility.   Currently in Pain? No/denies            [facility] PT Assessment - 04/06/17 0001      Assessment   Medical Diagnosis Lt shoulder pain     AROM   Right Shoulder External Rotation 79 Degrees   Left Shoulder External Rotation 60 Degrees   Cervical Extension 38   Cervical - Right Rotation 50   Cervical - Left Rotation 51     Strength   Overall Strength Comments mid traps 4+/5                     [facility] Adult PT Treatment/Exercise - 04/06/17 0001      Shoulder Exercises: Prone   Retraction Strengthening;Both;Weights;10 reps  3 sets   Retraction Weight (lbs) 1     Shoulder Exercises: Sidelying   External Rotation Strengthening;Both;15 reps;Weights  2 sets   External Rotation Weight (lbs) 3   Other Sidelying Exercises 2x15 empty can, in small ROM , bilat, 2#     Shoulder Exercises: ROM/Strengthening   UBE (Upper Arm Bike) L1x4' alt FWD/BWD     Shoulder Exercises: Stretch   External Rotation Stretch 1 rep;60 seconds  in supine with 1# each side     Manual Therapy   Manual Therapy Soft tissue mobilization   Soft tissue mobilization STM to bilat pecs, trigger point release          Trigger Point Dry Needling - 04/06/17 0813    Education Handout Provided Yes                   PT Long Term Goals - 04/06/17 0803      PT LONG TERM GOAL #1   Title I with advanced HEP ( 04/22/17)    Status On-going     PT LONG TERM GOAL #  2   Title report =/> 75% reduction in bilat shoulder pain to allow him to sleep per his previous level ( 04/22/17)    Status On-going     PT LONG TERM GOAL #3   Title increase bilat shoulder ER to [facility] to allow him to donn his button up shirts without pain ( 04/22/17)    Status On-going     PT LONG TERM GOAL #4   Title increase mid trap strength =/> 4+/5 ( 04/22/17)    Status Achieved     PT LONG TERM GOAL #5   Title improve bilat cervical rotation =/< 60 degrees ( 04/22/17)    Status On-going     PT LONG TERM GOAL #6   Title improve FOTO =/> 30% limited ( 04/22/17)    Status On-going               Plan - 04/06/17 0756    Clinical Impression Statement Ercil demonstrates increased bilat shoulder ROM and increased upper back strength.  He reports minimal change in his shoulder pain though.  He is very tight in his pecs and posterior shoulder muscles.  Discussed trying DN with him at next appointment.  He has met one goal.    Rehab Potential Good   PT Frequency 2x / week   PT Duration 4 weeks   PT  Treatment/Interventions Moist Heat;Ultrasound;Therapeutic exercise;Dry needling;Manual techniques;Neuromuscular re-education;Cryotherapy;Electrical Stimulation;Iontophoresis 72m/ml Dexamethasone;Patient/family education;Passive range of motion   PT Next Visit Plan DN if patient open to it.    Consulted and Agree with Plan of Care Patient      Patient will benefit from skilled therapeutic intervention in order to improve the following deficits and impairments:  Postural dysfunction, Pain, Decreased strength, Impaired UE functional use, Increased muscle spasms, Decreased range of motion  Visit Diagnosis: Other muscle spasm  Abnormal posture  Muscle weakness (generalized)  Acute pain of both shoulders     Problem List Patient Active Problem List   Diagnosis Date Noted  . Nasal congestion 03/12/2017  . Left shoulder pain 01/19/2017  . Snores 01/19/2017  . Bleeding from right ear 06/19/2016  . Prediabetes 05/30/2016  . Family history of colon cancer 05/28/2016  . Hypertension 03/15/2014  . Hyperlipidemia 03/15/2014  . History of melanoma 03/15/2014  . BPH (benign prostatic hyperplasia) 03/15/2014  . Impingement syndrome of right shoulder 03/15/2014    SJeral PinchPT  04/06/2017, 9:51 AM  [facility] [address]Phone: [phone]  Fax:  [phone] Name: Derrick TejeraMRN: [id number] of Birth: 501-21-1957

## 2017-04-06 NOTE — Patient Instructions (Signed)

## 2017-04-09 ENCOUNTER — Ambulatory Visit (INDEPENDENT_AMBULATORY_CARE_PROVIDER_SITE_OTHER): Payer: 59 | Admitting: Physical Therapy

## 2017-04-09 DIAGNOSIS — M25511 Pain in right shoulder: Secondary | ICD-10-CM

## 2017-04-09 DIAGNOSIS — M6281 Muscle weakness (generalized): Secondary | ICD-10-CM

## 2017-04-09 DIAGNOSIS — R293 Abnormal posture: Secondary | ICD-10-CM

## 2017-04-09 DIAGNOSIS — M25512 Pain in left shoulder: Secondary | ICD-10-CM

## 2017-04-09 DIAGNOSIS — M62838 Other muscle spasm: Secondary | ICD-10-CM

## 2017-04-09 NOTE — Therapy (Addendum)
[place], [place], [address] Phone: [phone]   Fax:  [phone]  Physical Therapy Treatment  Patient Details  Name: Derrick Blackwell MRN: [id number] Date of Birth: [birth year] Referring Provider: Dr. Georgina Snell  Encounter Date: 04/09/2017      PT End of Session - 04/09/17 1613    Visit Number 5   Number of Visits 8   Date for PT Re-Evaluation 04/22/17   PT Start Time 1607   PT Stop Time 1710   PT Time Calculation (min) 56 min   Activity Tolerance Patient tolerated treatment well      Past Medical History:  Diagnosis Date  . BPH (benign prostatic hyperplasia)   . Environmental allergies   . Hyperlipidemia   . Hypertension    pt denies, no meds  . Kidney infection    hospitalized as a child, no infections since  . Kidney stones   . Melanoma [facility])     Past Surgical History:  Procedure Laterality Date  . COLONOSCOPY    . LITHOTRIPSY     dry not in water 1990  . MELANOMA EXCISION     local only  . WISDOM TOOTH EXTRACTION      There were no vitals filed for this visit.      Subjective Assessment - 04/09/17 1616    Subjective Pt reports he is a little frustrated because he doesn't feel like the shoulder isn't getting better, he has been using the ball more and is now having epidsodes of his lower thoracic spine kind of locking up., these are lasting about 5 secs before he feels he can move again.    Patient Stated Goals get dressed without pain, sleep better and have improved flexibility.   Currently in Pain? No/denies                         [facility] Adult PT Treatment/Exercise - 04/09/17 0001      Shoulder Exercises: ROM/Strengthening   UBE (Upper Arm Bike) L2x4' alt FWD/BWD     Modalities   Modalities Electrical Stimulation;Moist Heat     Moist Heat Therapy   Number Minutes Moist Heat 15 Minutes   Moist Heat Location Shoulder  Rt and upper trap      Electrical Stimulation   Electrical Stimulation Location Rt shoulder comple   Electrical Stimulation Action IFC   Electrical Stimulation Parameters to tolerance   Electrical Stimulation Goals Pain;Tone     Manual Therapy   Manual Therapy Soft tissue mobilization   Soft tissue mobilization STM to Rt pecs, upper traps/levator, posterior RTC muslces.           Trigger Point Dry Needling - 04/09/17 1657    Consent Given? Yes   Education Handout Provided No   Muscles Treated Upper Body Upper trapezius;Pectoralis major;Pectoralis minor;Levator scapulae;Supraspinatus;Infraspinatus;Subscapularis  all Rt side   Upper Trapezius Response Palpable increased muscle length;Twitch reponse elicited   Pectoralis Major Response Twitch response elicited;Palpable increased muscle length   Pectoralis Minor Response Twitch response elicited;Palpable increased muscle length   Levator Scapulae Response Palpable increased muscle length;Twitch response elicited   Supraspinatus Response Twitch response elicited;Palpable increased muscle length   Infraspinatus Response Palpable increased muscle length;Twitch response elicited   Subscapularis Response Twitch response elicited;Palpable increased muscle length                   PT Long Term Goals - 04/06/17 3710  PT LONG TERM GOAL #1   Title I with advanced HEP ( 04/22/17)    Status On-going     PT LONG TERM GOAL #2   Title report =/> 75% reduction in bilat shoulder pain to allow him to sleep per his previous level ( 04/22/17)    Status On-going     PT LONG TERM GOAL #3   Title increase bilat shoulder ER to [facility] to allow him to donn his button up shirts without pain ( 04/22/17)    Status On-going     PT LONG TERM GOAL #4   Title increase mid trap strength =/> 4+/5 ( 04/22/17)    Status Achieved     PT LONG TERM GOAL #5   Title improve bilat cervical rotation =/< 60 degrees ( 04/22/17)    Status On-going     PT LONG TERM GOAL #6   Title  improve FOTO =/> 30% limited ( 04/22/17)    Status On-going               Plan - 04/09/17 1658    Clinical Impression Statement Leyton had good response to manual work and DN today.  Palpable muscle change after treatment and he reported he could move his rt shoulder around without the clicking.     Rehab Potential Good   PT Frequency 2x / week   PT Duration 4 weeks   PT Treatment/Interventions Moist Heat;Ultrasound;Therapeutic exercise;Dry needling;Manual techniques;Neuromuscular re-education;Cryotherapy;Electrical Stimulation;Iontophoresis 4mg /ml Dexamethasone;Patient/family education;Passive range of motion   PT Next Visit Plan manual work Rt shoulder complex, increased flexibility, assess response to DN, needle Lt shoulder complex Thursday   Consulted and Agree with Plan of Care Patient      Patient will benefit from skilled therapeutic intervention in order to improve the following deficits and impairments:  Postural dysfunction, Pain, Decreased strength, Impaired UE functional use, Increased muscle spasms, Decreased range of motion  Visit Diagnosis: Other muscle spasm  Abnormal posture  Muscle weakness (generalized)  Acute pain of both shoulders     Problem List Patient Active Problem List   Diagnosis Date Noted  . Nasal congestion 03/12/2017  . Left shoulder pain 01/19/2017  . Snores 01/19/2017  . Bleeding from right ear 06/19/2016  . Prediabetes 05/30/2016  . Family history of colon cancer 05/28/2016  . Hypertension 03/15/2014  . Hyperlipidemia 03/15/2014  . History of melanoma 03/15/2014  . BPH (benign prostatic hyperplasia) 03/15/2014  . Impingement syndrome of right shoulder 03/15/2014    Jeral Pinch PT  04/09/2017, 5:15 PM  [facility] [place], [place], [address] Phone: [phone]   Fax:  [phone]  Name: Isabel Freese MRN: [id number] Date of Birth: [birth year]

## 2017-04-13 ENCOUNTER — Ambulatory Visit (INDEPENDENT_AMBULATORY_CARE_PROVIDER_SITE_OTHER): Payer: 59 | Admitting: Physical Therapy

## 2017-04-13 DIAGNOSIS — M62838 Other muscle spasm: Secondary | ICD-10-CM

## 2017-04-13 DIAGNOSIS — M6281 Muscle weakness (generalized): Secondary | ICD-10-CM

## 2017-04-13 DIAGNOSIS — M25511 Pain in right shoulder: Secondary | ICD-10-CM | POA: Diagnosis not present

## 2017-04-13 DIAGNOSIS — M25512 Pain in left shoulder: Secondary | ICD-10-CM

## 2017-04-13 DIAGNOSIS — R293 Abnormal posture: Secondary | ICD-10-CM

## 2017-04-13 NOTE — Therapy (Addendum)
[place], [place], [address] Phone: [phone]   Fax:  [phone]  Physical Therapy Treatment  Patient Details  Name: Derrick Blackwell MRN: [id number] Date of Birth: 05/18/1956 Referring Provider: Dr. Georgina Snell  Encounter Date: 04/13/2017      PT End of Session - 04/13/17 0828    Visit Number 6   Number of Visits 8   Date for PT Re-Evaluation 04/22/17   PT Start Time 0805   PT Stop Time 0848   PT Time Calculation (min) 43 min      Past Medical History:  Diagnosis Date  . BPH (benign prostatic hyperplasia)   . Environmental allergies   . Hyperlipidemia   . Hypertension    pt denies, no meds  . Kidney infection    hospitalized as a child, no infections since  . Kidney stones   . Melanoma [facility])     Past Surgical History:  Procedure Laterality Date  . COLONOSCOPY    . LITHOTRIPSY     dry not in water 1990  . MELANOMA EXCISION     local only  . WISDOM TOOTH EXTRACTION      There were no vitals filed for this visit.      Subjective Assessment - 04/13/17 0824    Subjective Pt continues to report frustration with progress.  He has decreased HEP exercise because he is not seeing significant changes.  After last session, the clicking returned in shoulder the following day.    He is still using ball, but he is not getting as much locking up in back.    Currently in Pain? Yes   Pain Score 1    Pain Location Shoulder   Pain Orientation Left   Pain Descriptors / Indicators Dull   Aggravating Factors  reaching behind back, sleeping on side   Pain Relieving Factors stretches, resting.             [facility] PT Assessment - 04/13/17 0001      Assessment   Medical Diagnosis Lt shoulder pain   Referring Provider Dr. Georgina Snell   Onset Date/Surgical Date 10/25/16   Hand Dominance Right   Next MD Visit PRN     ROM / Strength   AROM / PROM / Strength AROM;PROM     AROM   Right/Left Shoulder  Left   Right Shoulder External Rotation 84 Degrees   Left Shoulder Internal Rotation --  to L1 behind back   Left Shoulder External Rotation 60 Degrees     PROM   PROM Assessment Site Shoulder   Right/Left Shoulder Left   Left Shoulder External Rotation 70 Degrees  with cane          [facility] Adult PT Treatment/Exercise - 04/13/17 0001      Shoulder Exercises: Supine   External Rotation AAROM;Left;10 reps  Cane, 10 sec hold.    Theraband Level (Shoulder External Rotation) Level 2 (Red)  2 sets of 10, cues for form.    Flexion --  overhead pull.    Other Supine Exercises Hooklying on 1/2 foam roller:  snow angel x 10 reps, then prolonged stretch with arms abdct 90 deg x 30 sec x 3 reps      Shoulder Exercises: Standing   Other Standing Exercises Lt / Rt wrist flexor stretch x 20 sec x 3 reps each side (increased tightness Lt>Rt)      Shoulder Exercises: ROM/Strengthening   UBE (Upper Arm Bike)  L2x4' alt FWD/BWD     Shoulder Exercises: Stretch   Other Shoulder Stretches shoulder ext with cane stretch x 5 sec x 5 reps;   Other Shoulder Stretches shoulder ext stretch holding onto door frame, 20 sec x 2 reps.   midlevel doorway stretch x 30 sec x 3 reps.                 PT Education - 04/13/17 1251    Education provided Yes   Education Details HEP - added shoulder ext and ER stretch with cane.    Person(s) Educated Patient   Methods Explanation;Handout   Comprehension Returned demonstration;Verbalized understanding             PT Long Term Goals - 04/13/17 0814      PT LONG TERM GOAL #1   Title I with advanced HEP ( 04/22/17)    Time 4   Period Weeks   Status On-going     PT LONG TERM GOAL #2   Title report =/> 75% reduction in bilat shoulder pain to allow him to sleep per his previous level ( 04/22/17)    Time 4   Period Weeks   Status On-going  waking ever 40 min now, was ever 20 min at eval.      PT LONG TERM GOAL #3   Title increase bilat shoulder  ER to [facility] to allow him to donn his button up shirts without pain ( 04/22/17)    Time 4   Period Weeks   Status On-going     PT LONG TERM GOAL #4   Title increase mid trap strength =/> 4+/5 ( 04/22/17)    Time 4   Period Weeks   Status Achieved     PT LONG TERM GOAL #5   Title improve bilat cervical rotation =/< 60 degrees ( 04/22/17)    Time 4   Period Weeks   Status On-going     PT LONG TERM GOAL #6   Title improve FOTO =/> 30% limited ( 04/22/17)    Time 4   Period Weeks   Status On-going               Plan - 04/13/17 1237    Clinical Impression Statement Pt reporting subtle changes in Lt shoulder since initiating therapy; now able to pull Lt thumb to ~ L1 (vs SI joint initially) and able to sleep at night with less occasions of waking. His posture and head position in standing is improving; this is taking some time for him to get accustomed to as he feels like he is tipping backwards (when his head is neutral).  He tolerated exercises well, with minor modifications to position of shoulder to improve form and decrease discomfort.  Pt making gradual gains towards established goals.    Rehab Potential Good   PT Frequency 2x / week   PT Duration 4 weeks   PT Treatment/Interventions Moist Heat;Ultrasound;Therapeutic exercise;Dry needling;Manual techniques;Neuromuscular re-education;Cryotherapy;Electrical Stimulation;Iontophoresis 4mg /ml Dexamethasone;Patient/family education;Passive range of motion   PT Next Visit Plan Possible manual therapy/DN to Lt shoulder complex.  Continue progressive stretching, ROM for Lt UE.  Measure cervical ROM (LTG#5)   Consulted and Agree with Plan of Care Patient      Patient will benefit from skilled therapeutic intervention in order to improve the following deficits and impairments:  Postural dysfunction, Pain, Decreased strength, Impaired UE functional use, Increased muscle spasms, Decreased range of motion  Visit Diagnosis: Other muscle  spasm  Abnormal posture  Muscle weakness (generalized)  Acute pain of both shoulders     Problem List Patient Active Problem List   Diagnosis Date Noted  . Nasal congestion 03/12/2017  . Left shoulder pain 01/19/2017  . Snores 01/19/2017  . Bleeding from right ear 06/19/2016  . Prediabetes 05/30/2016  . Family history of colon cancer 05/28/2016  . Hypertension 03/15/2014  . Hyperlipidemia 03/15/2014  . History of melanoma 03/15/2014  . BPH (benign prostatic hyperplasia) 03/15/2014  . Impingement syndrome of right shoulder 03/15/2014   Kerin Perna, PTA 04/13/17 1:00 PM  [facility] [place], [place], [address] Phone: [phone]   Fax:  [phone]  Name: Toan Mort MRN: [id number] Date of Birth: [birth year]

## 2017-04-13 NOTE — Patient Instructions (Addendum)
Cane Exercise: Extension    Stand holding cane behind back with both hands palm-up. Lift the cane away from body. Hold __5-10_ seconds. Repeat __10__ times. Do _1___ sessions per day.  External Rotation (Eccentric), Active-Assist - Supine (Cane)    Lie on back, Left arm out from side 45 deg, elbow at 90.. Use cane to assist in lifting forearm of affected arm to neutral. Slowly lower for 3-5 seconds. __10_ reps per set   [facility] [place], [place] [address]  [phone] (office) [phone] (fax)

## 2017-04-16 ENCOUNTER — Ambulatory Visit (INDEPENDENT_AMBULATORY_CARE_PROVIDER_SITE_OTHER): Payer: 59 | Admitting: Physical Therapy

## 2017-04-16 ENCOUNTER — Encounter: Payer: Self-pay | Admitting: Physical Therapy

## 2017-04-16 DIAGNOSIS — M25512 Pain in left shoulder: Secondary | ICD-10-CM

## 2017-04-16 DIAGNOSIS — M6281 Muscle weakness (generalized): Secondary | ICD-10-CM

## 2017-04-16 DIAGNOSIS — M25511 Pain in right shoulder: Secondary | ICD-10-CM

## 2017-04-16 DIAGNOSIS — M62838 Other muscle spasm: Secondary | ICD-10-CM | POA: Diagnosis not present

## 2017-04-16 DIAGNOSIS — R293 Abnormal posture: Secondary | ICD-10-CM | POA: Diagnosis not present

## 2017-04-16 NOTE — Therapy (Signed)
[place], [place], [address] Phone: [phone]   Fax:  [phone]  Physical Therapy Treatment  Patient Details  Name: Derrick Blackwell MRN: [id number] Date of Birth: [birth year] Referring Provider: Dr. Georgina Snell  Encounter Date: 04/16/2017      PT End of Session - 04/16/17 1609    Visit Number 7   Number of Visits 8   Date for PT Re-Evaluation 04/22/17   PT Start Time 6962   PT Stop Time 1709   PT Time Calculation (min) 60 min   Activity Tolerance Patient tolerated treatment well      Past Medical History:  Diagnosis Date  . BPH (benign prostatic hyperplasia)   . Environmental allergies   . Hyperlipidemia   . Hypertension    pt denies, no meds  . Kidney infection    hospitalized as a child, no infections since  . Kidney stones   . Melanoma [facility])     Past Surgical History:  Procedure Laterality Date  . COLONOSCOPY    . LITHOTRIPSY     dry not in water 1990  . MELANOMA EXCISION     local only  . WISDOM TOOTH EXTRACTION      There were no vitals filed for this visit.      Subjective Assessment - 04/16/17 1609    Subjective Pt reports he is able to get quicker relief when using the ball for TPR on the right side since last week.     Patient Stated Goals get dressed without pain, sleep better and have improved flexibility.   Currently in Pain? Yes   Pain Score 1    Pain Location Shoulder   Pain Orientation Left   Pain Descriptors / Indicators Dull   Pain Type Acute pain   Pain Onset More than a month ago   Pain Frequency Intermittent   Aggravating Factors  sleeping on his side and reaching for belt/shirt   Pain Relieving Factors resting and stretches, TPR with ball                         [facility] Adult PT Treatment/Exercise - 04/16/17 0001      Shoulder Exercises: Standing   Horizontal ABduction Strengthening;Both;15 reps;Theraband  leaning on noodle, some  symptoms into Lt UE   Theraband Level (Shoulder Horizontal ABduction) Level 1 (Yellow)   Other Standing Exercises 15 reps D1 flexion leaning on pool noodle, yellow band     Shoulder Exercises: ROM/Strengthening   UBE (Upper Arm Bike) L2x4' alt FWD/BWD     Modalities   Modalities Electrical Stimulation;Moist Heat     Moist Heat Therapy   Number Minutes Moist Heat 15 Minutes   Moist Heat Location Shoulder;Cervical  Lt arm     Electrical Stimulation   Electrical Stimulation Location Lt shoulder and neck  Lt forearm   Electrical Stimulation Action premod   Electrical Stimulation Parameters  to tolerance   Electrical Stimulation Goals Pain;Tone     Manual Therapy   Manual Therapy Soft tissue mobilization   Soft tissue mobilization STM to Lt pecs, upper traps/levator, posterior RTC muslces.  Lt forearm ECBR muscles.           Trigger Point Dry Needling - 04/16/17 1613    Consent Given? Yes   Education Handout Provided No   Muscles Treated Upper Body Upper trapezius;Pectoralis major;Pectoralis minor;Levator scapulae;Subscapularis;Infraspinatus;Supraspinatus  all Lt    Upper Trapezius Response Palpable  increased muscle length;Twitch reponse elicited   Pectoralis Major Response Twitch response elicited;Palpable increased muscle length   Pectoralis Minor Response Palpable increased muscle length   Levator Scapulae Response Twitch response elicited;Palpable increased muscle length   Supraspinatus Response Palpable increased muscle length;Twitch response elicited   Infraspinatus Response Twitch response elicited;Palpable increased muscle length   Subscapularis Response Palpable increased muscle length;Twitch response elicited                   PT Long Term Goals - 04/13/17 3154      PT LONG TERM GOAL #1   Title I with advanced HEP ( 04/22/17)    Time 4   Period Weeks   Status On-going     PT LONG TERM GOAL #2   Title report =/> 75% reduction in bilat shoulder pain  to allow him to sleep per his previous level ( 04/22/17)    Time 4   Period Weeks   Status On-going  waking ever 40 min now, was ever 20 min at eval.      PT LONG TERM GOAL #3   Title increase bilat shoulder ER to [facility] to allow him to donn his button up shirts without pain ( 04/22/17)    Time 4   Period Weeks   Status On-going     PT LONG TERM GOAL #4   Title increase mid trap strength =/> 4+/5 ( 04/22/17)    Time 4   Period Weeks   Status Achieved     PT LONG TERM GOAL #5   Title improve bilat cervical rotation =/< 60 degrees ( 04/22/17)    Time 4   Period Weeks   Status On-going     PT LONG TERM GOAL #6   Title improve FOTO =/> 30% limited ( 04/22/17)    Time 4   Period Weeks   Status On-going               Plan - 04/16/17 1746    Clinical Impression Statement Derrick Blackwell had good response to manual work and DN on in the Lt UE, from upper trap to forearm.  Good twitch responses and ease of movement with less symptoms afterwards.  Will see 1-2 more visits and then assess for discharge vs renewal. He is still having arm pain with certain movements however motion and strength are improving.    Rehab Potential Good   PT Frequency 2x / week   PT Duration 4 weeks   PT Treatment/Interventions Moist Heat;Ultrasound;Therapeutic exercise;Dry needling;Manual techniques;Neuromuscular re-education;Cryotherapy;Electrical Stimulation;Iontophoresis 4mg /ml Dexamethasone;Patient/family education;Passive range of motion   PT Next Visit Plan assess response to DN and manual work, discuss renewal vs discharge this visit or next.    Consulted and Agree with Plan of Care Patient      Patient will benefit from skilled therapeutic intervention in order to improve the following deficits and impairments:  Postural dysfunction, Pain, Decreased strength, Impaired UE functional use, Increased muscle spasms, Decreased range of motion  Visit Diagnosis: Other muscle spasm  Abnormal posture  Muscle  weakness (generalized)  Acute pain of both shoulders     Problem List Patient Active Problem List   Diagnosis Date Noted  . Nasal congestion 03/12/2017  . Left shoulder pain 01/19/2017  . Snores 01/19/2017  . Bleeding from right ear 06/19/2016  . Prediabetes 05/30/2016  . Family history of colon cancer 05/28/2016  . Hypertension 03/15/2014  . Hyperlipidemia 03/15/2014  . History of melanoma 03/15/2014  . BPH (benign  prostatic hyperplasia) 03/15/2014  . Impingement syndrome of right shoulder 03/15/2014    Jeral Pinch PT  04/16/2017, 5:53 PM  [facility] [place], [place], [address] Phone: [phone]   Fax:  [phone]  Name: Derrick Blackwell MRN: [id number] Date of Birth: [birth year]

## 2017-04-20 ENCOUNTER — Ambulatory Visit (INDEPENDENT_AMBULATORY_CARE_PROVIDER_SITE_OTHER): Payer: 59 | Admitting: Physical Therapy

## 2017-04-20 DIAGNOSIS — M25511 Pain in right shoulder: Secondary | ICD-10-CM | POA: Diagnosis not present

## 2017-04-20 DIAGNOSIS — M62838 Other muscle spasm: Secondary | ICD-10-CM | POA: Diagnosis not present

## 2017-04-20 DIAGNOSIS — M25512 Pain in left shoulder: Secondary | ICD-10-CM | POA: Diagnosis not present

## 2017-04-20 DIAGNOSIS — M6281 Muscle weakness (generalized): Secondary | ICD-10-CM

## 2017-04-20 DIAGNOSIS — R293 Abnormal posture: Secondary | ICD-10-CM

## 2017-04-20 NOTE — Therapy (Addendum)
[place], [place], [address] Phone: [phone]   Fax:  [phone]  Physical Therapy Treatment  Patient Details  Name: Derrick Blackwell MRN: [id number] Date of Birth: Jan 16, 1956 Referring Provider: Dr Georgina Snell  Encounter Date: 04/20/2017      PT End of Session - 04/20/17 0801    Visit Number 8   Number of Visits 8   PT Start Time 0802   PT Stop Time 3159   PT Time Calculation (min) 45 min   Activity Tolerance Patient tolerated treatment well      Past Medical History:  Diagnosis Date  . BPH (benign prostatic hyperplasia)   . Environmental allergies   . Hyperlipidemia   . Hypertension    pt denies, no meds  . Kidney infection    hospitalized as a child, no infections since  . Kidney stones   . Melanoma [facility])     Past Surgical History:  Procedure Laterality Date  . COLONOSCOPY    . LITHOTRIPSY     dry not in water 1990  . MELANOMA EXCISION     local only  . WISDOM TOOTH EXTRACTION      There were no vitals filed for this visit.      Subjective Assessment - 04/20/17 0803    Subjective Derrick Blackwell reports that he was very sore after the needling. sleeping better at night, not at baseline.    Patient Stated Goals get dressed without pain, sleep better and have improved flexibility.   Currently in Pain? Yes   Pain Score 1    Pain Location Shoulder   Pain Orientation Left   Pain Descriptors / Indicators Dull   Pain Onset More than a month ago   Pain Frequency Intermittent   Aggravating Factors  movement   Pain Relieving Factors stretches.             [facility] PT Assessment - 04/20/17 0001      Assessment   Medical Diagnosis Lt shoulder pain   Referring Provider Dr Georgina Snell   Onset Date/Surgical Date 10/25/16   Hand Dominance Right   Next MD Visit PRN     Observation/Other Assessments   Focus on Therapeutic Outcomes (FOTO)  44% limited     AROM   AROM Assessment Site  Shoulder;Cervical   Right Shoulder External Rotation 93 Degrees   Left Shoulder Internal Rotation --  behind back to ~ L1, up to L3 with towel stretch   Left Shoulder External Rotation 61 Degrees   Cervical - Right Rotation 55   Cervical - Left Rotation 65     Strength   Overall Strength Comments mid traps 4+/5                     [facility] Adult PT Treatment/Exercise - 04/20/17 0001      Shoulder Exercises: Supine   External Rotation Strengthening;Both;Theraband  30 reps   Theraband Level (Shoulder External Rotation) Level 3 (Green)     Shoulder Exercises: Standing   Row Strengthening;Both;Theraband  high and midx 30reps   Theraband Level (Shoulder Row) Level 3 (Green)     Shoulder Exercises: ROM/Strengthening   UBE (Upper Arm Bike) L2x4' alt FWD/BWD     Shoulder Exercises: Stretch   Other Shoulder Stretches doorway, mid x 45 sec, 30 sec stretchs bilat shoulders with strap in doorway.                 PT Education -  04/20/17 0960    Education provided Yes   Education Details HEP   Person(s) Educated Patient   Methods Explanation;Demonstration;Handout   Comprehension Returned demonstration;Verbalized understanding             PT Long Term Goals - 04/20/17 0801      PT LONG TERM GOAL #1   Title I with advanced HEP ( 04/22/17)    Status Achieved     PT LONG TERM GOAL #2   Title report =/> 75% reduction in bilat shoulder pain to allow him to sleep per his previous level ( 04/22/17)    Status On-going  changes sometimes up to 60 min before waking and other nights less      PT LONG TERM GOAL #3   Title increase bilat shoulder ER to [facility] to allow him to donn his button up shirts without pain ( 04/22/17)    Status Partially Met  ER improved slightly, able to donn shirts easier.      PT LONG TERM GOAL #4   Title increase mid trap strength =/> 4+/5 ( 04/22/17)    Status Achieved     PT LONG TERM GOAL #5   Title improve bilat cervical rotation =/<  60 degrees ( 04/22/17)      PT LONG TERM GOAL #6   Title improve FOTO =/> 30% limited ( 04/22/17)                Plan - 04/20/17 0840    Clinical Impression Statement Derrick Blackwell continues to make slow progress, he has partially met his goals. He reports he feels like if he continues to perform his HEP he will continue to improve.    Rehab Potential Good   PT Treatment/Interventions Moist Heat;Ultrasound;Therapeutic exercise;Dry needling;Manual techniques;Neuromuscular re-education;Cryotherapy;Electrical Stimulation;Iontophoresis 44m/ml Dexamethasone;Patient/family education;Passive range of motion   PT Next Visit Plan place pt on hold for two weeks, he will perform HEP, if he needs to return for therapy he will call, other wise discharge in two weeks.     Consulted and Agree with Plan of Care Patient      Patient will benefit from skilled therapeutic intervention in order to improve the following deficits and impairments:  Postural dysfunction, Pain, Decreased strength, Impaired UE functional use, Increased muscle spasms, Decreased range of motion  Visit Diagnosis: Other muscle spasm  Abnormal posture  Muscle weakness (generalized)  Acute pain of both shoulders     Problem List Patient Active Problem List   Diagnosis Date Noted  . Nasal congestion 03/12/2017  . Left shoulder pain 01/19/2017  . Snores 01/19/2017  . Bleeding from right ear 06/19/2016  . Prediabetes 05/30/2016  . Family history of colon cancer 05/28/2016  . Hypertension 03/15/2014  . Hyperlipidemia 03/15/2014  . History of melanoma 03/15/2014  . BPH (benign prostatic hyperplasia) 03/15/2014  . Impingement syndrome of right shoulder 03/15/2014    Derrick PinchPT 04/20/2017, 8:48 AM  [facility] [place] [address]Phone: [phone]  Fax:  [phone] Name: Derrick GrittonMRN: [id number] of Birth: [birth year]   PHYSICAL THERAPY DISCHARGE SUMMARY  Visits from Start of Care: 8  Current functional level related to goals / functional outcomes: See above for function at last visit   Remaining deficits: minimal   Education / Equipment: HEP Plan: Patient agrees to discharge.  Patient goals were partially met. Patient is being discharged due to being pleased with the current functional  level.  ?????Pt was placed on hold and he was to perform his HEP, If his pain returned he was to return for therapy.  He has not called so being discharged     Jeral Blackwell, PT 06/01/17 10:25 AM

## 2017-04-21 ENCOUNTER — Encounter: Payer: Self-pay | Admitting: Family Medicine

## 2017-04-21 ENCOUNTER — Encounter: Payer: Self-pay | Admitting: Physical Therapy

## 2017-04-23 ENCOUNTER — Encounter: Payer: 59 | Admitting: Physical Therapy

## 2017-06-25 ENCOUNTER — Ambulatory Visit (INDEPENDENT_AMBULATORY_CARE_PROVIDER_SITE_OTHER): Payer: 59 | Admitting: Family Medicine

## 2017-06-25 ENCOUNTER — Encounter: Payer: Self-pay | Admitting: Family Medicine

## 2017-06-25 VITALS — BP 146/90 | HR 84 | Wt 214.0 lb

## 2017-06-25 DIAGNOSIS — M545 Low back pain, unspecified: Secondary | ICD-10-CM | POA: Insufficient documentation

## 2017-06-25 DIAGNOSIS — I10 Essential (primary) hypertension: Secondary | ICD-10-CM

## 2017-06-25 DIAGNOSIS — R079 Chest pain, unspecified: Secondary | ICD-10-CM

## 2017-06-25 DIAGNOSIS — M25512 Pain in left shoulder: Secondary | ICD-10-CM | POA: Diagnosis not present

## 2017-06-25 DIAGNOSIS — G8929 Other chronic pain: Secondary | ICD-10-CM

## 2017-06-25 MED ORDER — LISINOPRIL 10 MG PO TABS: 10.0000 mg | ORAL_TABLET | Freq: Every day | ORAL | 1 refills | Status: DC

## 2017-06-25 NOTE — Patient Instructions (Addendum)
Thank you for coming in today.  You should hear about heart ultrasound back ultrasound and MRI of the left shoulder in the next few days.  You should also hear about heart doctor appointment  Follow-up with me a few days after your MRI.  Return sooner if needed  Call or go to the emergency room if you get worse, have trouble breathing, have chest pains, or palpitations.  Come back or go to the emergency room if you notice new weakness new numbness problems walking or bowel or bladder problems.  Restart Lisinopril for blood pressure.  I sent a prescription to the pharmacy just incase you cannot find it at home.   We should see each other in about a month or sooner.     Nonspecific Chest Pain Chest pain can be caused by many different conditions. There is always a chance that your pain could be related to something serious, such as a heart attack or a blood clot in your lungs. Chest pain can also be caused by conditions that are not life-threatening. If you have chest pain, it is very important to follow up with your health care provider. What are the causes? Causes of this condition include:  Heartburn.  Pneumonia or bronchitis.  Anxiety or stress.  Inflammation around your heart (pericarditis) or lung (pleuritis or pleurisy).  A blood clot in your lung.  A collapsed lung (pneumothorax). This can develop suddenly on its own (spontaneous pneumothorax) or from trauma to the chest.  Shingles infection (varicella-zoster virus).  Heart attack.  Damage to the bones, muscles, and cartilage that make up your chest wall. This can include: ? Bruised bones due to injury. ? Strained muscles or cartilage due to frequent or repeated coughing or overwork. ? Fracture to one or more ribs. ? Sore cartilage due to inflammation (costochondritis).  What increases the risk? Risk factors for this condition may include:  Activities that increase your risk for trauma or injury to your  chest.  Respiratory infections or conditions that cause frequent coughing.  Medical conditions or overeating that can cause heartburn.  Heart disease or family history of heart disease.  Conditions or health behaviors that increase your risk of developing a blood clot.  Having had chicken pox (varicella zoster).  What are the signs or symptoms? Chest pain can feel like:  Burning or tingling on the surface of your chest or deep in your chest.  Crushing, pressure, aching, or squeezing pain.  Dull or sharp pain that is worse when you move, cough, or take a deep breath.  Pain that is also felt in your back, neck, shoulder, or arm, or pain that spreads to any of these areas.  Your chest pain may come and go, or it may stay constant. How is this diagnosed? Lab tests or other studies may be needed to find the cause of your pain. Your health care provider may have you take a test called an ECG (electrocardiogram). An ECG records your heartbeat patterns at the time the test is performed. You may also have other tests, such as:  Transthoracic echocardiogram (TTE). In this test, sound waves are used to create a picture of the heart structures and to look at how blood flows through your heart.  Transesophageal echocardiogram (TEE).This is a more advanced imaging test that takes images from inside your body. It allows your health care provider to see your heart in finer detail.  Cardiac monitoring. This allows your health care provider to monitor your heart  rate and rhythm in real time.  Holter monitor. This is a portable device that records your heartbeat and can help to diagnose abnormal heartbeats. It allows your health care provider to track your heart activity for several days, if needed.  Stress tests. These can be done through exercise or by taking medicine that makes your heart beat more quickly.  Blood tests.  Other imaging tests.  How is this treated? Treatment depends on what  is causing your chest pain. Treatment may include:  Medicines. These may include: ? Acid blockers for heartburn. ? Anti-inflammatory medicine. ? Pain medicine for inflammatory conditions. ? Antibiotic medicine, if an infection is present. ? Medicines to dissolve blood clots. ? Medicines to treat coronary artery disease (CAD).  Supportive care for conditions that do not require medicines. This may include: ? Resting. ? Applying heat or cold packs to injured areas. ? Limiting activities until pain decreases.  Follow these instructions at home: Medicines  If you were prescribed an antibiotic, take it as told by your health care provider. Do not stop taking the antibiotic even if you start to feel better.  Take over-the-counter and prescription medicines only as told by your health care provider. Lifestyle  Do not use any products that contain nicotine or tobacco, such as cigarettes and e-cigarettes. If you need help quitting, ask your health care provider.  Do not drink alcohol.  Make lifestyle changes as directed by your health care provider. These may include: ? Getting regular exercise. Ask your health care provider to suggest some activities that are safe for you. ? Eating a heart-healthy diet. A registered dietitian can help you to learn healthy eating options. ? Maintaining a healthy weight. ? Managing diabetes, if necessary. ? Reducing stress, such as with yoga or relaxation techniques. General instructions  Avoid any activities that bring on chest pain.  If heartburn is the cause for your chest pain, raise (elevate) the head of your bed about 6 inches (15 cm) by putting blocks under the legs. Sleeping with more pillows does not effectively relieve heartburn because it only changes the position of your head.  Keep all follow-up visits as told by your health care provider. This is important. This includes any further testing if your chest pain does not go away. Contact a  health care provider if:  Your chest pain does not go away.  You have a rash with blisters on your chest.  You have a fever.  You have chills. Get help right away if:  Your chest pain is worse.  You have a cough that gets worse, or you cough up blood.  You have severe pain in your abdomen.  You have severe weakness.  You faint.  You have sudden, unexplained chest discomfort.  You have sudden, unexplained discomfort in your arms, back, neck, or jaw.  You have shortness of breath at any time.  You suddenly start to sweat, or your skin gets clammy.  You feel nauseous or you vomit.  You suddenly feel light-headed or dizzy.  Your heart begins to beat quickly, or it feels like it is skipping beats. These symptoms may represent a serious problem that is an emergency. Do not wait to see if the symptoms will go away. Get medical help right away. Call your local emergency services (911 in the [place] Do not drive yourself to the hospital. This information is not intended to replace advice given to you by your health care provider. Make sure you discuss any questions  you have with your health care provider. Document Released: 07/23/2005 Document Revised: 07/07/2016 Document Reviewed: 07/07/2016 Elsevier Interactive Patient Education  2017 [employer].

## 2017-06-25 NOTE — Progress Notes (Signed)
Derrick Blackwell is a 61 y.o. male who presents to [place] [place] today for chest pain back pain and shoulder pain.  Chest pain: Patient is left-sided chest pain radiating to the left shoulder started this morning. This occurred when he was doing gardening. The pain does not seem to be exertional or reproducible. The pain is intermittent and not associated with palpitations or shortness of breath. He denies fevers or chills and does not take any medications for her symptoms.  Back/flank pain: Patient notes a chronic issue of left low back/left flank pain. This has been ongoing for several months and is worse with prolonged standing and walking. He notes the pain is sore and feels similar to kidney stones but not as intense. He denies any blood in the urine urinary frequency urgency or dysuria.  Additionally patient has continued left shoulder pain. This is located in the anterior to lateral upper arm is worse with overhead motion and activity. He received some benefit from a subacromial bursa injection earlier this year. He additionally has had significant physical therapy which has helped a little but he continues to have symptoms.  Blood pressure: Patient has a history of hypertension but has stopped the lisinopril a few months ago. He thought perhaps it was causing his back pain. He notes the pain is not changed at all since he stopped the lisinopril.   Past Medical History:  Diagnosis Date  . BPH (benign prostatic hyperplasia)   . Environmental allergies   . Hyperlipidemia   . Hypertension    pt denies, no meds  . Kidney infection    hospitalized as a child, no infections since  . Kidney stones   . Melanoma [facility])    Past Surgical History:  Procedure Laterality Date  . COLONOSCOPY    . LITHOTRIPSY     dry not in water 1990  . MELANOMA EXCISION     local only  .  WISDOM TOOTH EXTRACTION     Social History  Substance Use Topics  . Smoking status: Current Some Day Smoker    Types: Cigars  . Smokeless tobacco: Never Used  . Alcohol use 0.5 oz/week    1 Standard drinks or equivalent per week   family history includes Breast cancer in his mother; Colon cancer (age of onset: 34) in his sister.  ROS as above:  Medications: Current Outpatient Prescriptions  Medication Sig Dispense Refill  . atorvastatin (LIPITOR) 10 MG tablet Take 1 tablet (10 mg total) by mouth daily. 90 tablet 3  . cetirizine (ZYRTEC) 10 MG tablet Take 10 mg by mouth daily.    Marland Kitchen doxazosin (CARDURA) 4 MG tablet TAKE 1 TABLET BY MOUTH ONCE DAILY. 90 tablet 3  . fexofenadine (ALLEGRA) 180 MG tablet Take 180 mg by mouth daily.    . fluticasone (FLONASE) 50 MCG/ACT nasal spray Place 2 sprays into both nostrils daily. 16 g 2  . lisinopril (PRINIVIL,ZESTRIL) 10 MG tablet Take 1 tablet (10 mg total) by mouth daily. 30 tablet 1  . montelukast (SINGULAIR) 10 MG tablet Take 1 tablet (10 mg total) by mouth at bedtime. (Patient not taking: Reported on 03/25/2017) 30 tablet 3  . naproxen sodium (ANAPROX) 220 MG tablet Take 220 mg by mouth daily. 2 tabs once daily    . neomycin-polymyxin-hydrocortisone (CORTISPORIN) otic solution Place 3 drops into the right ear 4 (four) times daily. (Patient not taking: Reported on 03/12/2017) 10 mL 0  No current facility-administered medications for this visit.    Allergies  Allergen Reactions  . Penicillins     Health Maintenance Health Maintenance  Topic Date Due  . COLONOSCOPY  03/07/2016  . INFLUENZA VACCINE  06/25/2018 (Originally 05/27/2017)  . TETANUS/TDAP  04/24/2025  . Hepatitis C Screening  Completed  . HIV Screening  Completed     Exam:  BP (!) 146/90   Pulse 84   Wt 214 lb (97.1 kg)   SpO2 98%   BMI 30.71 kg/m  Gen: Well NAD HEENT: EOMI,  MMM Lungs: Normal work of breathing. CTABL Heart: RRR no MRG Chest wall nontender Abd:  NABS, Soft. Nondistended, Nontender No CVA angle tenderness to percussion Exts: Brisk capillary refill, warm and well perfused.  Left shoulder normal-appearing nontender normal motion positive impingement testing normal strength. L spine: Nontender to midline normal back motion nontender throughout.  12-lead EKG shows normal sinus rhythm at 84 bpm. No ST segment elevation or depression. Normal EKG.`  No results found for this or any previous visit (from the past 72 hour(s)). No results found.    Assessment and Plan: 61 y.o. male with  Chest pain: Unclear etiology. Likely musculoskeletal. EKG is reassuringly normal. However patient is a 61 year old male with hypertension and mild hyperlipidemia. Plan to obtain an echocardiogram and refer to cardiology for risk factor stratification. Precautions reviewed.   Left shoulder pain: Likely subacromial bursitis versus impingement. Patient has failed conservative management with physical therapy and injection. Symptoms are interfering with quality of life. Plan for MRI.  Back pain: Unclear etiology as well. Symptoms are somewhat consistent with musculoskeletal etiology however he notes they're similar to previous kidney stone etiology. Plan for renal ultrasound to assess the kidneys. This is no radiation and less expensive than a CT scan. If symptoms continue and we still don't now CT scan is probably the next best option.  Hypertension: Blood pressure is elevated. Patient has discontinued as well as lisinopril. Plan to restart lisinopril.  We'll recheck blood pressure and labs in about a month.   Orders Placed This Encounter  Procedures  . MR Shoulder Left Wo Contrast    Standing Status:   Future    Standing Expiration Date:   08/25/2018    Order Specific Question:   What is the patient's sedation requirement?    Answer:   No Sedation    Order Specific Question:   Does the patient have a pacemaker or implanted devices?    Answer:   No    Order  Specific Question:   Preferred imaging location?    Answer:   [facility] (table limit-350lbs)    Order Specific Question:   Radiology Contrast Protocol - do NOT remove file path    Answer:   [email or web address]  . US Renal    Standing Status:   Future    Standing Expiration Date:   08/25/2018    Order Specific Question:   Reason for Exam (SYMPTOM  OR DIAGNOSIS REQUIRED)    Answer:   eval left low back pain suspect kidney etiology based on pt symptoms    Order Specific Question:   Preferred imaging location?    Answer:   [facility]  . Ambulatory referral to Cardiology    Referral Priority:   Routine    Referral Type:   Consultation    Referral Reason:   Specialty Services Required    Requested Specialty:   Cardiology    Number of Visits Requested:   1  .  ECHOCARDIOGRAM COMPLETE    Standing Status:   Future    Standing Expiration Date:   09/25/2018    Order Specific Question:   Where should this test be performed    Answer:   [facility]    Order Specific Question:   Perflutren DEFINITY (image enhancing agent) should be administered unless hypersensitivity or allergy exist    Answer:   Administer Perflutren    Order Specific Question:   Expected Date:    Answer:   1 month   Meds ordered this encounter  Medications  . lisinopril (PRINIVIL,ZESTRIL) 10 MG tablet    Sig: Take 1 tablet (10 mg total) by mouth daily.    Dispense:  30 tablet    Refill:  1     Discussed warning signs or symptoms. Please see discharge instructions. Patient expresses understanding.  I spent 40 minutes with this patient, greater than 50% was face-to-face time counseling regarding differential diagnosis treatment plan options medication side effects.

## 2017-06-26 NOTE — Addendum Note (Signed)
Addended by: Beatris Ship L on: 06/26/2017 11:21 AM   Modules accepted: Orders

## 2017-07-01 ENCOUNTER — Ambulatory Visit (INDEPENDENT_AMBULATORY_CARE_PROVIDER_SITE_OTHER): Payer: 59

## 2017-07-01 DIAGNOSIS — G8929 Other chronic pain: Secondary | ICD-10-CM

## 2017-07-01 DIAGNOSIS — R109 Unspecified abdominal pain: Secondary | ICD-10-CM

## 2017-07-01 DIAGNOSIS — M545 Low back pain, unspecified: Secondary | ICD-10-CM

## 2017-07-02 ENCOUNTER — Encounter: Payer: Self-pay | Admitting: Family Medicine

## 2017-07-02 ENCOUNTER — Other Ambulatory Visit (HOSPITAL_BASED_OUTPATIENT_CLINIC_OR_DEPARTMENT_OTHER): Payer: Self-pay

## 2017-07-24 ENCOUNTER — Ambulatory Visit: Payer: Self-pay | Admitting: Cardiology

## 2018-05-31 ENCOUNTER — Other Ambulatory Visit: Payer: Self-pay | Admitting: Family Medicine

## 2018-06-01 ENCOUNTER — Other Ambulatory Visit: Payer: Self-pay | Admitting: Family Medicine

## 2018-06-21 ENCOUNTER — Encounter: Payer: Self-pay | Admitting: Family Medicine

## 2018-06-22 MED ORDER — DOXAZOSIN MESYLATE 4 MG PO TABS: 4.0000 mg | ORAL_TABLET | Freq: Every day | ORAL | 5 refills | Status: DC

## 2018-07-09 ENCOUNTER — Ambulatory Visit: Payer: BLUE CROSS/BLUE SHIELD | Admitting: Physician Assistant

## 2018-07-09 ENCOUNTER — Encounter: Payer: Self-pay | Admitting: Physician Assistant

## 2018-07-09 VITALS — BP 174/101 | HR 91 | Temp 98.1°F | Wt 215.0 lb

## 2018-07-09 DIAGNOSIS — R197 Diarrhea, unspecified: Secondary | ICD-10-CM

## 2018-07-09 DIAGNOSIS — Z800 Family history of malignant neoplasm of digestive organs: Secondary | ICD-10-CM

## 2018-07-09 DIAGNOSIS — Z1211 Encounter for screening for malignant neoplasm of colon: Secondary | ICD-10-CM

## 2018-07-09 DIAGNOSIS — I10 Essential (primary) hypertension: Secondary | ICD-10-CM | POA: Diagnosis not present

## 2018-07-09 MED ORDER — AMLODIPINE BESYLATE 5 MG PO TABS: 5.0000 mg | ORAL_TABLET | Freq: Every day | ORAL | 2 refills | Status: DC

## 2018-07-09 MED ORDER — DIPHENOXYLATE-ATROPINE 2.5-0.025 MG PO TABS: 1.0000 | ORAL_TABLET | Freq: Four times a day (QID) | ORAL | 0 refills | Status: DC | PRN

## 2018-07-09 NOTE — Progress Notes (Signed)
HPI:                                                                Derrick Blackwell is a 62 y.o. male who presents to [place] [place] today for diarrhea  For 1 week patient has had soft, loose bowel movements 2-3 times per day, which have become more watery. Symptoms have been waxing and waning. Prior to diarrhea he noted increased belching and increased gas. He denies epigastric pain, indigestion, early satiety. He had some dull left lower abdominal pain this morning after a bowel movement, otherwise no abdominal pain.  Denies fever, malaise, vomiting, hematochezia/melena. Denies recent travel. Denies recent antibiotic use Has not been taking any OTC medications He is eating and drinking normally, urine is pale yellow He is overdue for screening colonoscopy. Reports he was scheduled for this in 2017, but insurance would not cover it. He now has new insurance.  States he has not taken his BP medication in over a year. "Made me feel off."  Denies vision change, chest pain, dyspnea, lightheadedness, dizziness.  No flowsheet data found.  No flowsheet data found.    Past Medical History:  Diagnosis Date  . BPH (benign prostatic hyperplasia)   . Environmental allergies   . Hyperlipidemia   . Hypertension    pt denies, no meds  . Kidney infection    hospitalized as a child, no infections since  . Kidney stones   . Melanoma [facility])    Past Surgical History:  Procedure Laterality Date  . COLONOSCOPY    . LITHOTRIPSY     dry not in water 1990  . MELANOMA EXCISION     local only  . WISDOM TOOTH EXTRACTION     Social History   Tobacco Use  . Smoking status: Current Some Day Smoker    Types: Cigars  . Smokeless tobacco: Never Used  Substance Use Topics  . Alcohol use: Yes    Alcohol/week: 1.0 standard drinks    Types: 1 Standard drinks or equivalent per week   family history includes Breast cancer in his mother; Colon cancer (age of  onset: 34) in his sister.    ROS: negative except as noted in the HPI  Medications: Current Outpatient Medications  Medication Sig Dispense Refill  . cromolyn (NASALCROM) 5.2 MG/ACT nasal spray Place 1 spray into both nostrils 4 (four) times daily.    Marland Kitchen amLODipine (NORVASC) 5 MG tablet Take 1 tablet (5 mg total) by mouth daily. 30 tablet 2  . cetirizine (ZYRTEC) 10 MG tablet Take 10 mg by mouth daily.    . diphenoxylate-atropine (LOMOTIL) 2.5-0.025 MG tablet Take 1 tablet by mouth 4 (four) times daily as needed for diarrhea or loose stools. 16 tablet 0  . doxazosin (CARDURA) 4 MG tablet Take 1 tablet (4 mg total) by mouth daily. 30 tablet 5  . fexofenadine (ALLEGRA) 180 MG tablet Take 180 mg by mouth daily.     No current facility-administered medications for this visit.    Allergies  Allergen Reactions  . Penicillins        Objective:  BP (!) 154/106   Pulse 91   Temp 98.1 F (36.7 C) (Oral)   Wt 215 lb (97.5 kg)  BMI 30.85 kg/m  Gen:  alert, not ill-appearing, no distress, appropriate for age 55: head normocephalic without obvious abnormality, conjunctiva and cornea clear, moist mucous membranes, oropharynx clear, trachea midline Pulm: Normal work of breathing, normal phonation, clear to auscultation bilaterally, no wheezes, rales or rhonchi CV: Normal rate, regular rhythm, s1 and s2 distinct, no murmurs, clicks or rubs  GI: abdomen soft, non-tender, no guarding or rigidity Neuro: alert and oriented x 3, no tremor MSK: extremities atraumatic, normal gait and station Skin: intact, no rashes on exposed skin, no jaundice, no cyanosis    No results found for this or any previous visit (from the past 72 hour(s)). No results found.    Assessment and Plan: 62 y.o. male with   .Derrick Blackwell was seen today for diarrhea.  Diagnoses and all orders for this visit:  Diarrhea of presumed infectious origin -     CBC with Differential/Platelet -     Comprehensive metabolic  panel -     Gastrointestinal Pathogen Panel PCR -     diphenoxylate-atropine (LOMOTIL) 2.5-0.025 MG tablet; Take 1 tablet by mouth 4 (four) times daily as needed for diarrhea or loose stools.  Essential hypertension -     amLODipine (NORVASC) 5 MG tablet; Take 1 tablet (5 mg total) by mouth daily.  Colon cancer screening -     Ambulatory referral to Gastroenterology  Family history of colon cancer -     Ambulatory referral to Gastroenterology     Afebrile, no tachypnea, no tachycardia, benign abdominal exam, blood pressure is uncontrolled secondary to noncompliance with medication.  Patient stated that lisinopril made him feel "off".  He is amenable to trying an alternative medication.  He will start amlodipine daily and follow-up with his PCP in 2 weeks Given his persistent symptoms for 1 week I will check CBC CMP and stool studies.  Otherwise he appears well, no evidence of dehydration, no left lower quadrant tenderness to suggest diverticulitis though this is on the differential.  Counseled on supportive care to include oral rehydration solution, antidiarrheals, bland diet.  Patient declined antiemetics A new referral was placed to digestive health for screening colonoscopy  Patient education and anticipatory guidance given Patient agrees with treatment plan Follow-up in 2 weeks with PCP for hypertension as needed if symptoms worsen or fail to improve  Darlyne Russian PA-C

## 2018-07-09 NOTE — Patient Instructions (Addendum)
Bland Diet A bland diet consists of foods that do not have a lot of fat or fiber. Foods without fat or fiber are easier for the body to digest. They are also less likely to irritate your mouth, throat, stomach, and other parts of your gastrointestinal tract. A bland diet is sometimes called a BRAT diet. What is my plan? Your health care provider or dietitian may recommend specific changes to your diet to prevent and treat your symptoms, such as:  Eating small meals often.  Cooking food until it is soft enough to chew easily.  Chewing your food well.  Drinking fluids slowly.  Not eating foods that are very spicy, sour, or fatty.  Not eating citrus fruits, such as oranges and grapefruit.  What do I need to know about this diet?  Eat a variety of foods from the bland diet food list.  Do not follow a bland diet longer than you have to.  Ask your health care provider whether you should take vitamins. What foods can I eat? Grains  Hot cereals, such as cream of wheat. Bread, crackers, or tortillas made from refined white flour. Rice. Vegetables Canned or cooked vegetables. Mashed or boiled potatoes. Fruits Bananas. Applesauce. Other types of cooked or canned fruit with the skin and seeds removed, such as canned peaches or pears. Meats and Other Protein Sources Scrambled eggs. Creamy peanut butter or other nut butters. Lean, well-cooked meats, such as chicken or fish. Tofu. Soups or broths. Dairy Low-fat dairy products, such as milk, cottage cheese, or yogurt. Beverages Water. Herbal tea. Apple juice. Sweets and Desserts Pudding. Custard. Fruit gelatin. Ice cream. Fats and Oils Mild salad dressings. Canola or olive oil. The items listed above may not be a complete list of allowed foods or beverages. Contact your dietitian for more options. What foods are not recommended? Foods and ingredients that are often not recommended include:  Spicy foods, such as hot sauce or  salsa.  Fried foods.  Sour foods, such as pickled or fermented foods.  Raw vegetables or fruits, especially citrus or berries.  Caffeinated drinks.  Alcohol.  Strongly flavored seasonings or condiments.  The items listed above may not be a complete list of foods and beverages that are not allowed. Contact your dietitian for more information. This information is not intended to replace advice given to you by your health care provider. Make sure you discuss any questions you have with your health care provider. Document Released: 02/04/2016 Document Revised: 03/20/2016 Document Reviewed: 10/25/2014 Elsevier Interactive Patient Education  2018 [employer].    Diarrhea, Adult Diarrhea is frequent loose and watery bowel movements. Diarrhea can make you feel weak and cause you to become dehydrated. Dehydration can make you tired and thirsty, cause you to have a dry mouth, and decrease how often you urinate. Diarrhea typically lasts 2-3 days. However, it can last longer if it is a sign of something more serious. It is important to treat your diarrhea as told by your health care provider. Follow these instructions at home: Eating and drinking  Follow these recommendations as told by your health care provider:  Take an oral rehydration solution (ORS). This is a drink that is sold at pharmacies and retail stores.  Drink clear fluids, such as water, ice chips, diluted fruit juice, and low-calorie sports drinks.  Eat bland, easy-to-digest foods in small amounts as you are able. These foods include bananas, applesauce, rice, lean meats, toast, and crackers.  Avoid drinking fluids that contain a  lot of sugar or caffeine, such as energy drinks, sports drinks, and soda.  Avoid alcohol.  Avoid spicy or fatty foods.  General instructions  Drink enough fluid to keep your urine clear or pale yellow.  Wash your hands often. If soap and water are not available, use hand sanitizer.  Make  sure that all people in your household wash their hands well and often.  Take over-the-counter and prescription medicines only as told by your health care provider.  Rest at home while you recover.  Watch your condition for any changes.  Take a warm bath to relieve any burning or pain from frequent diarrhea episodes.  Keep all follow-up visits as told by your health care provider. This is important. Contact a health care provider if:  You have a fever.  Your diarrhea gets worse.  You have new symptoms.  You cannot keep fluids down.  You feel light-headed or dizzy.  You have a headache  You have muscle cramps. Get help right away if:  You have chest pain.  You feel extremely weak or you faint.  You have bloody or black stools or stools that look like tar.  You have severe pain, cramping, or bloating in your abdomen.  You have trouble breathing or you are breathing very quickly.  Your heart is beating very quickly.  Your skin feels cold and clammy.  You feel confused.  You have signs of dehydration, such as: ? Dark urine, very little urine, or no urine. ? Cracked lips. ? Dry mouth. ? Sunken eyes. ? Sleepiness. ? Weakness. This information is not intended to replace advice given to you by your health care provider. Make sure you discuss any questions you have with your health care provider. Document Released: 10/03/2002 Document Revised: 02/21/2016 Document Reviewed: 06/19/2015 Elsevier Interactive Patient Education  [employer].

## 2018-07-10 LAB — CBC WITH DIFFERENTIAL/PLATELET
Basophils Absolute: 71 cells/uL (ref 0–200)
Basophils Relative: 1 %
EOS PCT: 1 %
Eosinophils Absolute: 71 cells/uL (ref 15–500)
HEMATOCRIT: 44.4 % (ref 38.5–50.0)
Hemoglobin: 15.5 g/dL (ref 13.2–17.1)
LYMPHS ABS: 2478 {cells}/uL (ref 850–3900)
MCH: 28.9 pg (ref 27.0–33.0)
MCHC: 34.9 g/dL (ref 32.0–36.0)
MCV: 82.8 fL (ref 80.0–100.0)
MONOS PCT: 8.6 %
MPV: 11.2 fL (ref 7.5–12.5)
Neutro Abs: 3870 cells/uL (ref 1500–7800)
Neutrophils Relative %: 54.5 %
PLATELETS: 191 10*3/uL (ref 140–400)
RBC: 5.36 10*6/uL (ref 4.20–5.80)
RDW: 13.1 % (ref 11.0–15.0)
TOTAL LYMPHOCYTE: 34.9 %
WBC mixed population: 611 cells/uL (ref 200–950)
WBC: 7.1 10*3/uL (ref 3.8–10.8)

## 2018-07-10 LAB — COMPREHENSIVE METABOLIC PANEL
AG Ratio: 1.8 (calc) (ref 1.0–2.5)
ALBUMIN MSPROF: 4.6 g/dL (ref 3.6–5.1)
ALT: 48 U/L — AB (ref 9–46)
AST: 26 U/L (ref 10–35)
Alkaline phosphatase (APISO): 48 U/L (ref 40–115)
BUN: 13 mg/dL (ref 7–25)
CO2: 26 mmol/L (ref 20–32)
CREATININE: 1.09 mg/dL (ref 0.70–1.25)
Calcium: 10.2 mg/dL (ref 8.6–10.3)
Chloride: 103 mmol/L (ref 98–110)
GLUCOSE: 96 mg/dL (ref 65–99)
Globulin: 2.6 g/dL (calc) (ref 1.9–3.7)
Potassium: 3.8 mmol/L (ref 3.5–5.3)
SODIUM: 139 mmol/L (ref 135–146)
TOTAL PROTEIN: 7.2 g/dL (ref 6.1–8.1)
Total Bilirubin: 0.3 mg/dL (ref 0.2–1.2)

## 2018-07-21 DIAGNOSIS — L570 Actinic keratosis: Secondary | ICD-10-CM | POA: Diagnosis not present

## 2018-07-21 DIAGNOSIS — Z85820 Personal history of malignant melanoma of skin: Secondary | ICD-10-CM | POA: Diagnosis not present

## 2018-07-21 DIAGNOSIS — D229 Melanocytic nevi, unspecified: Secondary | ICD-10-CM | POA: Diagnosis not present

## 2018-07-23 ENCOUNTER — Ambulatory Visit: Payer: BLUE CROSS/BLUE SHIELD | Admitting: Family Medicine

## 2018-09-03 DIAGNOSIS — Z1211 Encounter for screening for malignant neoplasm of colon: Secondary | ICD-10-CM | POA: Diagnosis not present

## 2018-09-03 DIAGNOSIS — D120 Benign neoplasm of cecum: Secondary | ICD-10-CM | POA: Diagnosis not present

## 2018-09-03 DIAGNOSIS — Z800 Family history of malignant neoplasm of digestive organs: Secondary | ICD-10-CM | POA: Diagnosis not present

## 2018-09-03 DIAGNOSIS — D122 Benign neoplasm of ascending colon: Secondary | ICD-10-CM | POA: Diagnosis not present

## 2018-09-03 LAB — HM COLONOSCOPY

## 2018-09-13 ENCOUNTER — Encounter: Payer: Self-pay | Admitting: Family Medicine

## 2018-12-28 ENCOUNTER — Other Ambulatory Visit: Payer: Self-pay | Admitting: Family Medicine

## 2019-01-27 ENCOUNTER — Other Ambulatory Visit: Payer: Self-pay | Admitting: Family Medicine

## 2019-02-25 ENCOUNTER — Other Ambulatory Visit: Payer: Self-pay | Admitting: Family Medicine

## 2019-03-13 ENCOUNTER — Encounter: Payer: Self-pay | Admitting: Family Medicine

## 2019-03-14 MED ORDER — DOXAZOSIN MESYLATE 4 MG PO TABS: 4.0000 mg | ORAL_TABLET | Freq: Every day | ORAL | 1 refills | Status: DC

## 2019-06-11 IMAGING — US US RENAL
1 series · 14 of 25 positions shown · non-contrast
Comparison: None.

CLINICAL DATA: Chronic left flank pain

EXAM:
RENAL / URINARY TRACT ULTRASOUND COMPLETE

[Series 1: us renal · 0.24mm/px · 14 of 31 slices shown]
[im 1/31]
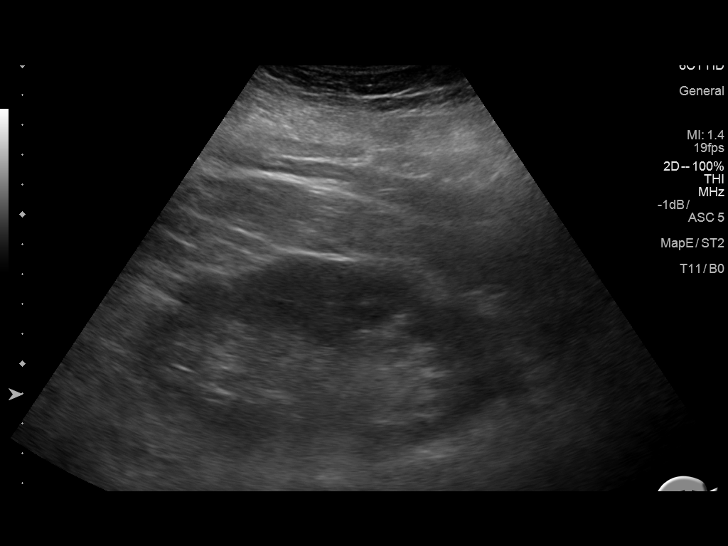
[im 3/31]
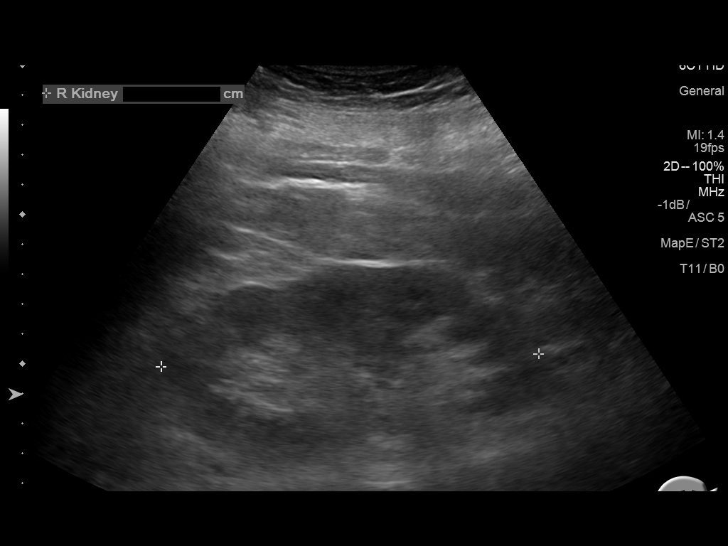
[im 6/31]
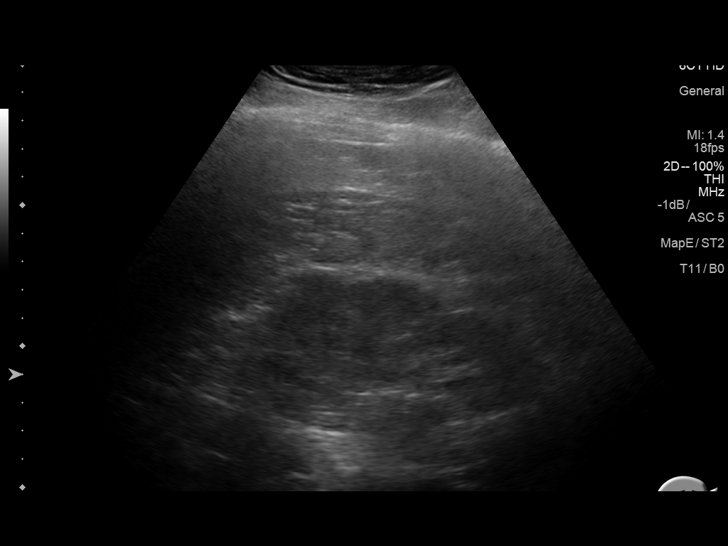
[im 8/31]
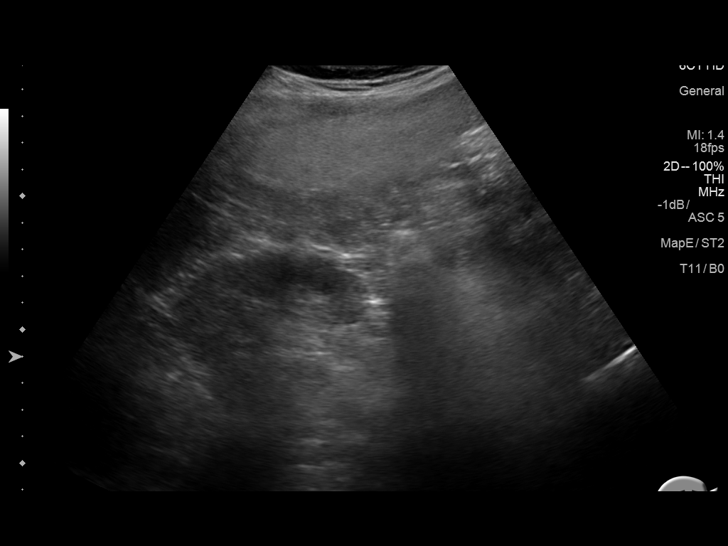
[im 11/31]
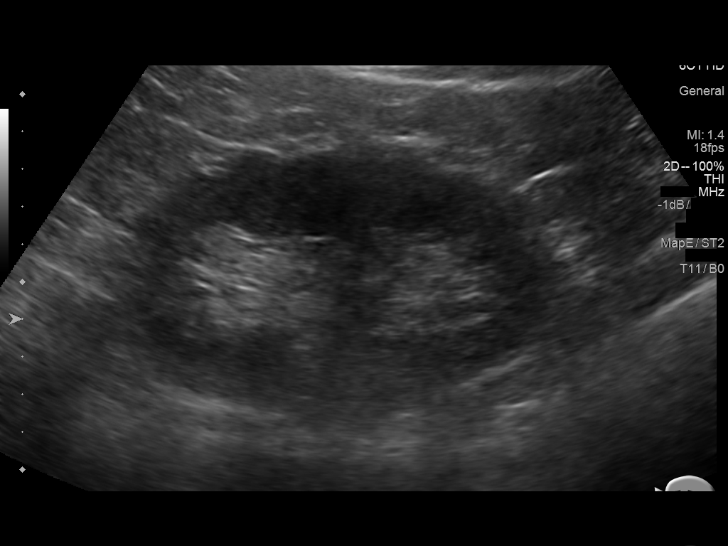
[im 12/31]
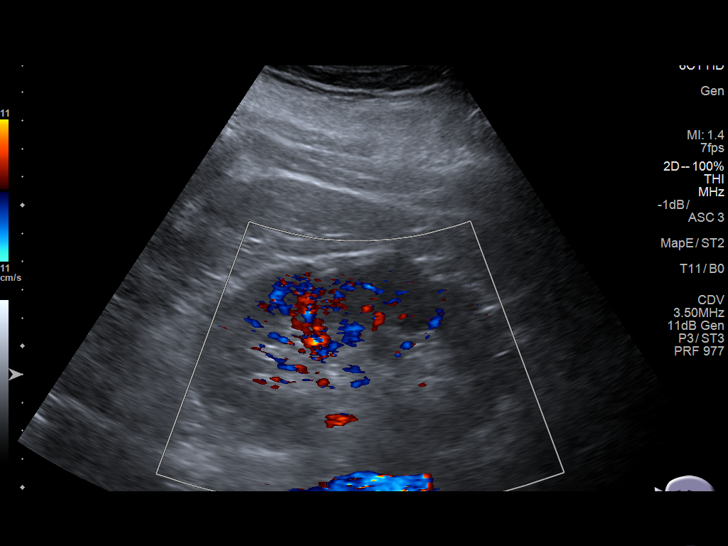
[im 14/31]
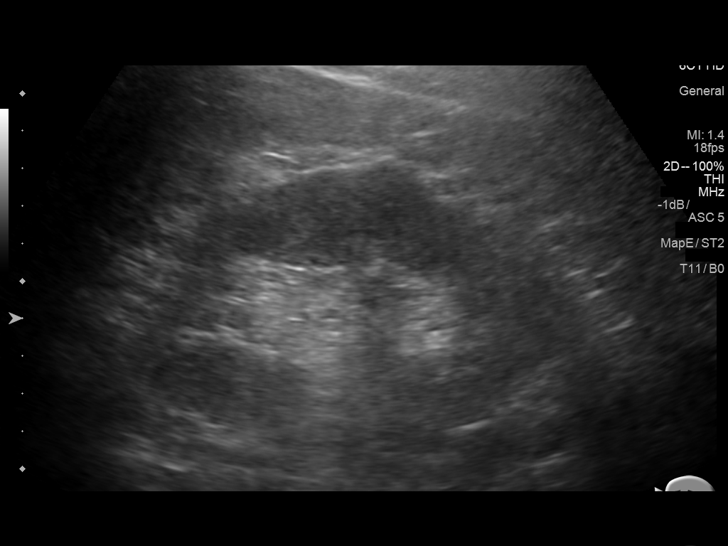
[im 17/31]
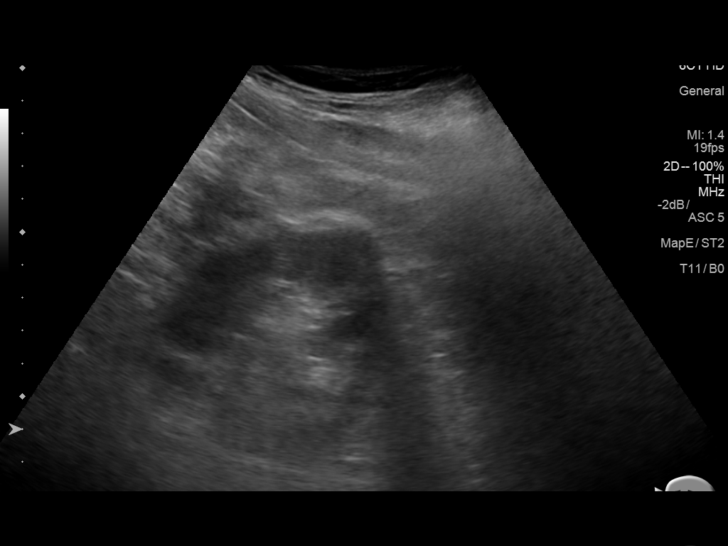
[im 19/31]
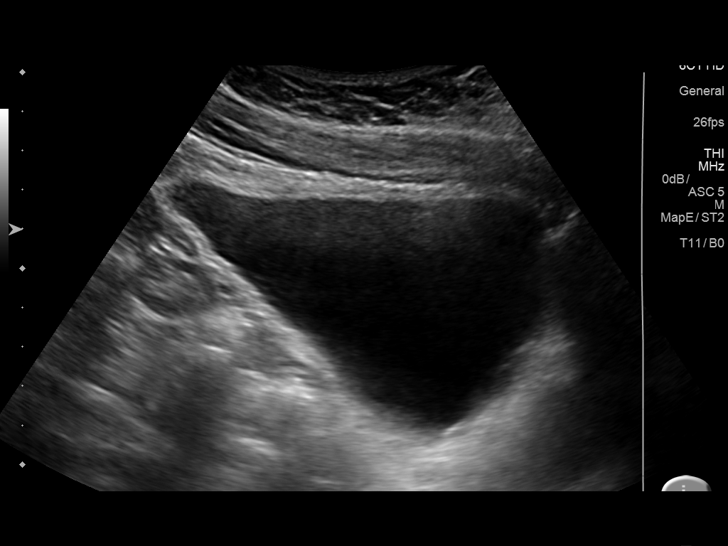
[im 21/31]
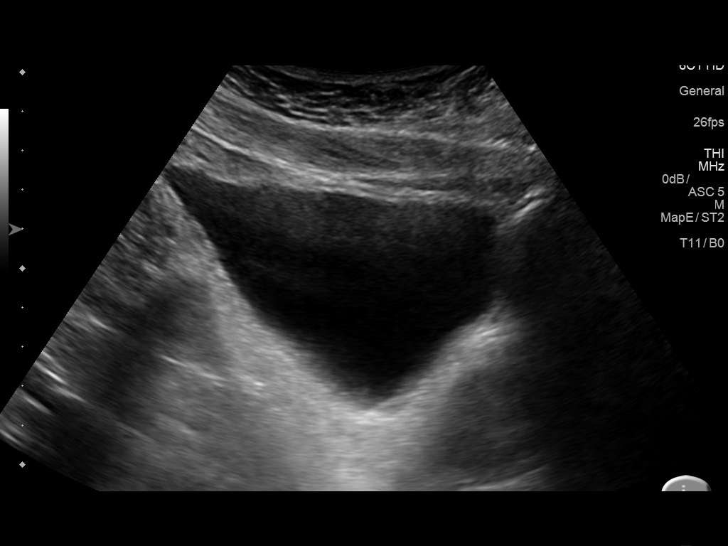
[im 23/31]
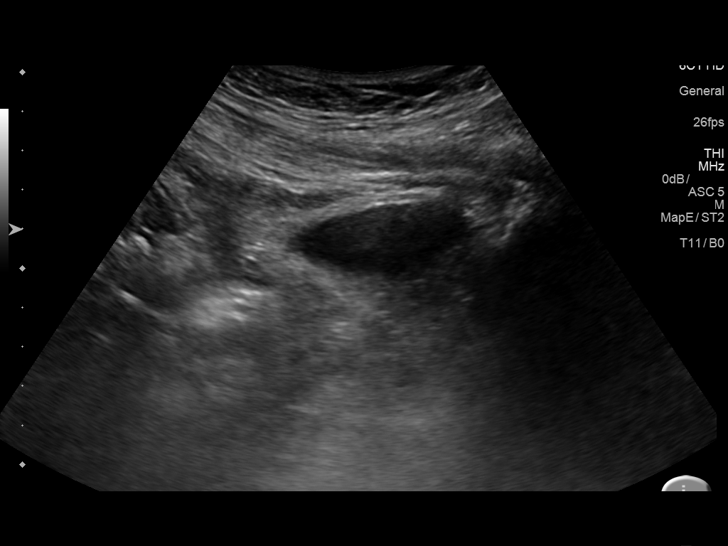
[im 26/31]
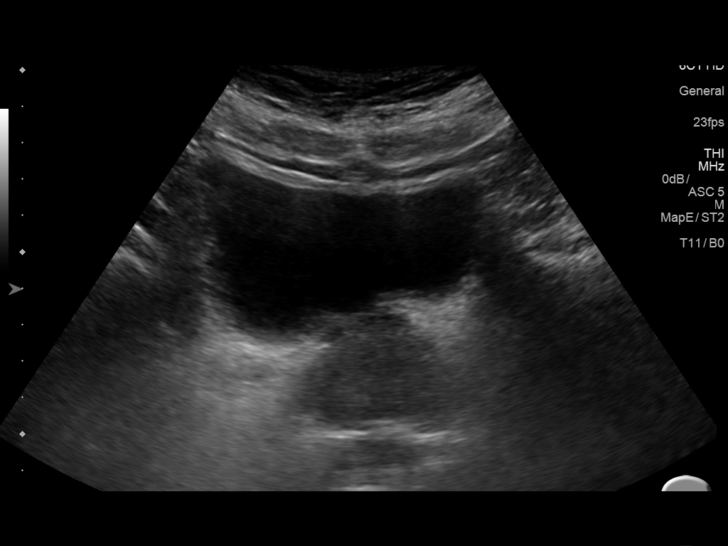
[im 28/31]
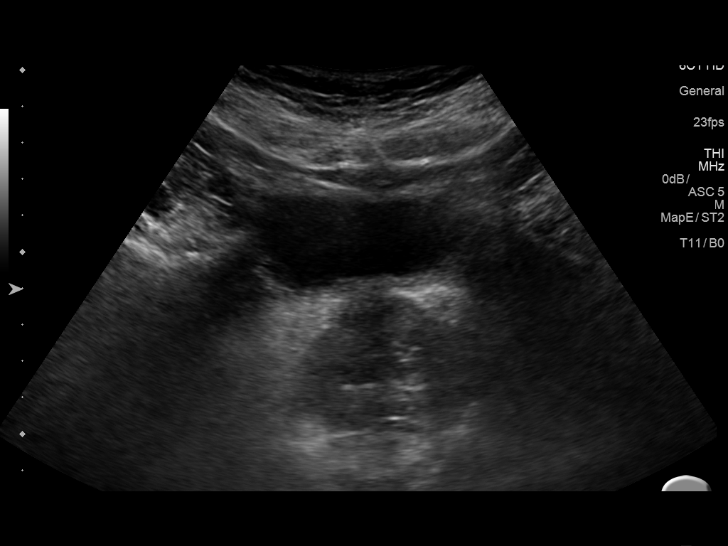
[im 31/31]
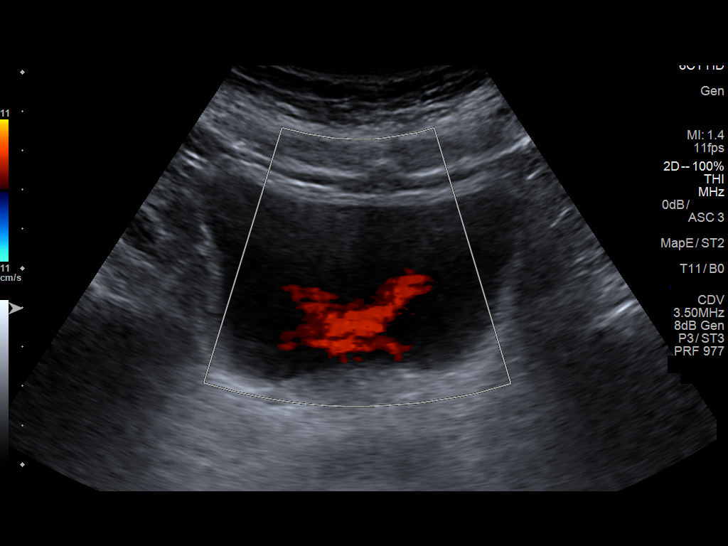

[14 of 25 positions shown; findings below may reference images not displayed]

FINDINGS: Right Kidney:

Length: 12.6 cm. Echogenicity and renal cortical thickness are
within normal limits. No mass, perinephric fluid, or hydronephrosis
visualized. No sonographically demonstrable calculus or
ureterectasis.

Left Kidney:

Length: 11.7 cm. Echogenicity and renal cortical thickness are
within normal limits. No mass, perinephric fluid, or hydronephrosis
visualized. No sonographically demonstrable calculus or
ureterectasis.

Bladder:

Appears normal for degree of bladder distention.

Prostate measures 4.7 x 3.6 cm. Prostate echogenicity is mildly
inhomogeneous.
IMPRESSION: Prostate rather prominent with inhomogeneous echotexture. This
finding may warrant correlation PSA. Study otherwise unremarkable.

## 2019-09-05 ENCOUNTER — Encounter: Payer: Self-pay | Admitting: Family Medicine

## 2019-09-05 MED ORDER — DOXAZOSIN MESYLATE 4 MG PO TABS: 4.0000 mg | ORAL_TABLET | Freq: Every day | ORAL | 1 refills | Status: DC

## 2020-03-08 ENCOUNTER — Other Ambulatory Visit: Payer: Self-pay | Admitting: Family Medicine

## 2020-04-06 ENCOUNTER — Other Ambulatory Visit: Payer: Self-pay | Admitting: Sports Medicine

## 2020-04-10 ENCOUNTER — Other Ambulatory Visit: Payer: Self-pay | Admitting: Sports Medicine

## 2020-04-11 ENCOUNTER — Ambulatory Visit (INDEPENDENT_AMBULATORY_CARE_PROVIDER_SITE_OTHER): Payer: BC Managed Care – PPO | Admitting: Family Medicine

## 2020-04-11 ENCOUNTER — Encounter: Payer: Self-pay | Admitting: Family Medicine

## 2020-04-11 ENCOUNTER — Other Ambulatory Visit: Payer: Self-pay

## 2020-04-11 VITALS — BP 148/84 | HR 77 | Temp 99.1°F | Ht 70.08 in | Wt 208.1 lb

## 2020-04-11 DIAGNOSIS — E782 Mixed hyperlipidemia: Secondary | ICD-10-CM

## 2020-04-11 DIAGNOSIS — N400 Enlarged prostate without lower urinary tract symptoms: Secondary | ICD-10-CM | POA: Diagnosis not present

## 2020-04-11 DIAGNOSIS — I10 Essential (primary) hypertension: Secondary | ICD-10-CM

## 2020-04-11 MED ORDER — DOXAZOSIN MESYLATE 4 MG PO TABS: 4.0000 mg | ORAL_TABLET | Freq: Every day | ORAL | 3 refills | Status: DC

## 2020-04-11 NOTE — Assessment & Plan Note (Signed)
Blood pressure is not at goal at for age and co-morbidities.  I recommend starting medication however he would like to hold off on this.   Recommend keeping an eye on BP at home and at work.  In addition they were instructed to follow a low sodium diet with regular exercise to help to maintain adequate control of blood pressure.

## 2020-04-11 NOTE — Assessment & Plan Note (Signed)
Stable with doxazosin, continue.  Update PSA

## 2020-04-11 NOTE — Patient Instructions (Signed)
Great to meet you today! I would recommend that you get a BP cuff and check readings at home and at work.  If BP remains elevated >140/90 medication recommendations would be amlodipine (norvasc) or olmesartan (benicar) Have labs completed, we'll be in touch with results.  I will plan to see you again in about 6 months.

## 2020-04-11 NOTE — Progress Notes (Signed)
Derrick Blackwell - 64 y.o. male MRN [id number]  Date of birth: 23-May-1956  Subjective Chief Complaint  Patient presents with  . Establish Care    HPI Derrick Blackwell is a 64 y.o. male with history of HTN, HLD, BPH, melanoma here today for follow up visit.  He is a little upset today due to his medication being denied and needing to come in for follow up.  Discussed with him that I had not met him in the past and last visit was >1 year ago.  He has been taking doxazosin for BPH symptoms and this continues to work well for him.  His BP has been elevated previously as well and he was prescribed amlodipine.  He states that he never took this.  He didn't believe that he actually needed to be on medication for this.  He had life insurancy policy completed last year and they did vitals which were normal with BP in the 120's/80's.  He has not had symptoms including chest pain, shortness of breath, palpitations, headache or vision changes.  He feel that he is healthy and tries to follow a healthy diet with exercise.  He denies significant stress related to work.   ROS:  A comprehensive ROS was completed and negative except as noted per HPI  Allergies  Allergen Reactions  . Penicillins     Past Medical History:  Diagnosis Date  . BPH (benign prostatic hyperplasia)   . Environmental allergies   . Hyperlipidemia   . Hypertension    pt denies, no meds  . Kidney infection    hospitalized as a child, no infections since  . Kidney stones   . Melanoma [facility])     Past Surgical History:  Procedure Laterality Date  . COLONOSCOPY    . LITHOTRIPSY     dry not in water 1990  . MELANOMA EXCISION     local only  . WISDOM TOOTH EXTRACTION      Social History   Socioeconomic History  . Marital status: Married    Spouse name: Not on file  . Number of children: Not on file  . Years of education: Not on file  . Highest education level: Not on file  Occupational History  . Occupation: CFO  Tobacco  Use  . Smoking status: Current Some Day Smoker    Types: Cigars  . Smokeless tobacco: Never Used  Vaping Use  . Vaping Use: Never used  Substance and Sexual Activity  . Alcohol use: Yes    Alcohol/week: 1.0 standard drink    Types: 1 Standard drinks or equivalent per week  . Drug use: No  . Sexual activity: Yes    Partners: Female  Other Topics Concern  . Not on file  Social History Narrative  . Not on file   Social Determinants of Health   Financial Resource Strain:   . Difficulty of Paying Living Expenses:   Food Insecurity:   . Worried About Charity fundraiser in the Last Year:   . Arboriculturist in the Last Year:   Transportation Needs:   . Film/video editor (Medical):   Marland Kitchen Lack of Transportation (Non-Medical):   Physical Activity:   . Days of Exercise per Week:   . Minutes of Exercise per Session:   Stress:   . Feeling of Stress :   Social Connections:   . Frequency of Communication with Friends and Family:   . Frequency of Social Gatherings with  Friends and Family:   . Attends Religious Services:   . Active Member of Clubs or Organizations:   . Attends Archivist Meetings:   Marland Kitchen Marital Status:     Family History  Problem Relation Age of Onset  . Breast cancer Mother 41  . Colon cancer Sister 108    Health Maintenance  Topic Date Due  . INFLUENZA VACCINE  05/27/2020  . TETANUS/TDAP  04/24/2025  . COLONOSCOPY  09/03/2028  . COVID-19 Vaccine  Completed  . Hepatitis C Screening  Completed  . HIV Screening  Completed     ----------------------------------------------------------------------------------------------------------------------------------------------------------------------------------------------------------------- Physical Exam BP (!) 148/84   Pulse 77   Temp 99.1 F (37.3 C) (Oral)   Ht 5' 10.08" (1.78 m)   Wt 208 lb 1.6 oz (94.4 kg)   SpO2 98%   BMI 29.79 kg/m   Physical Exam Constitutional:      Appearance: Normal  appearance.  HENT:     Head: Normocephalic and atraumatic.  Eyes:     General: No scleral icterus. Cardiovascular:     Rate and Rhythm: Normal rate and regular rhythm.  Pulmonary:     Effort: Pulmonary effort is normal.     Breath sounds: Normal breath sounds.  Musculoskeletal:     Cervical back: Neck supple.  Neurological:     General: No focal deficit present.     Mental Status: He is alert.  Psychiatric:        Mood and Affect: Mood normal.        Behavior: Behavior normal.     ------------------------------------------------------------------------------------------------------------------------------------------------------------------------------------------------------------------- Assessment and Plan  Hypertension Blood pressure is not at goal at for age and co-morbidities.  I recommend starting medication however he would like to hold off on this.   Recommend keeping an eye on BP at home and at work.  In addition they were instructed to follow a low sodium diet with regular exercise to help to maintain adequate control of blood pressure.    BPH (benign prostatic hyperplasia) Stable with doxazosin, continue.  Update PSA  Hyperlipidemia Update lipid panel.     Meds ordered this encounter  Medications  . doxazosin (CARDURA) 4 MG tablet    Sig: Take 1 tablet (4 mg total) by mouth daily.    Dispense:  90 tablet    Refill:  3    Return in about 6 months (around 10/11/2020) for HTN.    This visit occurred during the SARS-CoV-2 public health emergency.  Safety protocols were in place, including screening questions prior to the visit, additional usage of staff PPE, and extensive cleaning of exam room while observing appropriate contact time as indicated for disinfecting solutions.

## 2020-04-11 NOTE — Assessment & Plan Note (Signed)
Update lipid panel.  

## 2020-04-20 DIAGNOSIS — E782 Mixed hyperlipidemia: Secondary | ICD-10-CM | POA: Diagnosis not present

## 2020-04-20 DIAGNOSIS — I10 Essential (primary) hypertension: Secondary | ICD-10-CM | POA: Diagnosis not present

## 2020-04-20 DIAGNOSIS — N400 Enlarged prostate without lower urinary tract symptoms: Secondary | ICD-10-CM | POA: Diagnosis not present

## 2020-04-24 LAB — HEMOGLOBIN A1C W/OUT EAG: Hgb A1c MFr Bld: 6.1 % of total Hgb — ABNORMAL HIGH (ref <5.7)

## 2020-04-24 LAB — CBC
HCT: 44.9 % (ref 38.5–50.0)
Hemoglobin: 15.1 g/dL (ref 13.2–17.1)
MCH: 28.7 pg (ref 27.0–33.0)
MCHC: 33.6 g/dL (ref 32.0–36.0)
MCV: 85.2 fL (ref 80.0–100.0)
MPV: 10.7 fL (ref 7.5–12.5)
Platelets: 171 10*3/uL (ref 140–400)
RBC: 5.27 10*6/uL (ref 4.20–5.80)
RDW: 13 % (ref 11.0–15.0)
WBC: 5.5 10*3/uL (ref 3.8–10.8)

## 2020-04-24 LAB — COMPLETE METABOLIC PANEL WITH GFR
AG Ratio: 1.8 (calc) (ref 1.0–2.5)
ALT: 28 U/L (ref 9–46)
AST: 15 U/L (ref 10–35)
Albumin: 4.6 g/dL (ref 3.6–5.1)
Alkaline phosphatase (APISO): 46 U/L (ref 35–144)
BUN: 12 mg/dL (ref 7–25)
CO2: 29 mmol/L (ref 20–32)
Calcium: 9.5 mg/dL (ref 8.6–10.3)
Chloride: 101 mmol/L (ref 98–110)
Creat: 0.94 mg/dL (ref 0.70–1.25)
GFR, Est African American: 99 mL/min/{1.73_m2} (ref >=60)
GFR, Est Non African American: 85 mL/min/{1.73_m2} (ref >=60)
Globulin: 2.5 g/dL (calc) (ref 1.9–3.7)
Glucose, Bld: 152 mg/dL — ABNORMAL HIGH (ref 65–99)
Potassium: 4.5 mmol/L (ref 3.5–5.3)
Sodium: 138 mmol/L (ref 135–146)
Total Bilirubin: 0.6 mg/dL (ref 0.2–1.2)
Total Protein: 7.1 g/dL (ref 6.1–8.1)

## 2020-04-24 LAB — LIPID PANEL
Cholesterol: 279 mg/dL — ABNORMAL HIGH (ref <200)
HDL: 36 mg/dL — ABNORMAL LOW (ref >=40)
LDL Cholesterol (Calc): 181 mg/dL (calc) — ABNORMAL HIGH
Non-HDL Cholesterol (Calc): 243 mg/dL (calc) — ABNORMAL HIGH (ref <130)
Total CHOL/HDL Ratio: 7.8 (calc) — ABNORMAL HIGH (ref <5.0)
Triglycerides: 367 mg/dL — ABNORMAL HIGH (ref <150)

## 2020-04-24 LAB — PSA: PSA: 0.6 ng/mL (ref <=4.0)

## 2020-05-02 ENCOUNTER — Other Ambulatory Visit: Payer: Self-pay | Admitting: Family Medicine

## 2020-05-02 MED ORDER — ROSUVASTATIN CALCIUM 20 MG PO TABS
20.0000 mg | ORAL_TABLET | Freq: Every day | ORAL | 3 refills | Status: DC
Start: 2020-05-02 — End: 2021-07-11

## 2021-03-07 DIAGNOSIS — L570 Actinic keratosis: Secondary | ICD-10-CM | POA: Diagnosis not present

## 2021-03-07 DIAGNOSIS — D2239 Melanocytic nevi of other parts of face: Secondary | ICD-10-CM | POA: Diagnosis not present

## 2021-03-07 DIAGNOSIS — D225 Melanocytic nevi of trunk: Secondary | ICD-10-CM | POA: Diagnosis not present

## 2021-03-07 DIAGNOSIS — D485 Neoplasm of uncertain behavior of skin: Secondary | ICD-10-CM | POA: Diagnosis not present

## 2021-03-07 DIAGNOSIS — L821 Other seborrheic keratosis: Secondary | ICD-10-CM | POA: Diagnosis not present

## 2021-03-28 ENCOUNTER — Other Ambulatory Visit: Payer: Self-pay | Admitting: Family Medicine

## 2021-07-02 ENCOUNTER — Other Ambulatory Visit: Payer: Self-pay | Admitting: Family Medicine

## 2021-07-03 ENCOUNTER — Other Ambulatory Visit: Payer: Self-pay | Admitting: Family Medicine

## 2021-07-04 ENCOUNTER — Other Ambulatory Visit: Payer: Self-pay

## 2021-07-04 MED ORDER — DOXAZOSIN MESYLATE 4 MG PO TABS: 4.0000 mg | ORAL_TABLET | Freq: Every day | ORAL | 0 refills | Status: DC

## 2021-07-04 NOTE — Telephone Encounter (Signed)
Pt called stating that his refill for doxazosin was denied and he is questioning why.  Advised pt that he has not been seen in our office in 15 months and will need OV.  Pt scheduled appt and advised that we will send in refill for 7 days to last until his appt.  Pt expressed understanding.  Charyl Bigger, CMA

## 2021-07-11 ENCOUNTER — Ambulatory Visit: Payer: BC Managed Care – PPO | Admitting: Family Medicine

## 2021-07-11 ENCOUNTER — Encounter: Payer: Self-pay | Admitting: Family Medicine

## 2021-07-11 ENCOUNTER — Other Ambulatory Visit: Payer: Self-pay

## 2021-07-11 VITALS — BP 158/77 | HR 80 | Temp 97.4°F | Wt 182.0 lb

## 2021-07-11 DIAGNOSIS — Z23 Encounter for immunization: Secondary | ICD-10-CM

## 2021-07-11 DIAGNOSIS — M545 Low back pain, unspecified: Secondary | ICD-10-CM

## 2021-07-11 DIAGNOSIS — E782 Mixed hyperlipidemia: Secondary | ICD-10-CM

## 2021-07-11 DIAGNOSIS — G8929 Other chronic pain: Secondary | ICD-10-CM

## 2021-07-11 DIAGNOSIS — R350 Frequency of micturition: Secondary | ICD-10-CM | POA: Diagnosis not present

## 2021-07-11 DIAGNOSIS — I10 Essential (primary) hypertension: Secondary | ICD-10-CM | POA: Diagnosis not present

## 2021-07-11 DIAGNOSIS — R7303 Prediabetes: Secondary | ICD-10-CM

## 2021-07-11 DIAGNOSIS — N401 Enlarged prostate with lower urinary tract symptoms: Secondary | ICD-10-CM | POA: Diagnosis not present

## 2021-07-11 MED ORDER — DOXAZOSIN MESYLATE 4 MG PO TABS: 4.0000 mg | ORAL_TABLET | Freq: Every day | ORAL | 2 refills | Status: DC

## 2021-07-11 NOTE — Assessment & Plan Note (Signed)
BP remains elevated.  Declines medication despite elevated risk of heart disease.  He will check at home and continue to work on dietary change. F/u in 6 months.

## 2021-07-11 NOTE — Assessment & Plan Note (Signed)
Given handout for HEP/spine conditioning.

## 2021-07-11 NOTE — Assessment & Plan Note (Signed)
Update a1c today . 

## 2021-07-11 NOTE — Patient Instructions (Signed)
Your blood pressure is still elevated.  Check readings at home and send in through mychart in 2 weeks. Follow a low salt diet.  We'll be in touch with labs.

## 2021-07-11 NOTE — Assessment & Plan Note (Signed)
He is not taking rosuvastatin.  The 10-year ASCVD risk score is: 39.7%.  Updating lipid panel. Encouraged statin use.

## 2021-07-11 NOTE — Assessment & Plan Note (Signed)
Doxazosin continues to work well for him.  Updating PSA today.

## 2021-07-11 NOTE — Progress Notes (Signed)
Derrick Blackwell - 65 y.o. male MRN [id number]  Date of birth: Jan 29, 1956  Subjective No chief complaint on file.   HPI Derrick Blackwell is a 65 y.o. male here today for follow up visit.  He has a history of HTN, HLD, BPH and prediabetes.  He reports that he is doing well.  Having some recurrent lumbar back pain.  Non radiating.   BP remains elevated.  He remains insistent that he doesn't want to take medication for this.  He is not checking readings at home.    He is taking cardura for management of BPH symptoms.  This is working well for him.    He is not taking crestor for HLD.  States he feels that he doesn't need it.  He has been working on dietary change.  Weight is down 26lbs since last visit.   Due for updated labs.   Allergies  Allergen Reactions   Penicillins     Past Medical History:  Diagnosis Date   BPH (benign prostatic hyperplasia)    Environmental allergies    Hyperlipidemia    Hypertension    pt denies, no meds   Kidney infection    hospitalized as a child, no infections since   Kidney stones    Melanoma [facility])     Past Surgical History:  Procedure Laterality Date   COLONOSCOPY     LITHOTRIPSY     dry not in water [place]     local only   WISDOM TOOTH EXTRACTION      Social History   Socioeconomic History   Marital status: Married    Spouse name: Not on file   Number of children: Not on file   Years of education: Not on file   Highest education level: Not on file  Occupational History   Occupation: CFO  Tobacco Use   Smoking status: Some Days    Types: Cigars   Smokeless tobacco: Never  Vaping Use   Vaping Use: Never used  Substance and Sexual Activity   Alcohol use: Yes    Alcohol/week: 1.0 standard drink    Types: 1 Standard drinks or equivalent per week   Drug use: No   Sexual activity: Yes    Partners: Female  Other Topics Concern   Not on file  Social History Narrative   Not on file   Social Determinants of  Health   Financial Resource Strain: Not on file  Food Insecurity: Not on file  Transportation Needs: Not on file  Physical Activity: Not on file  Stress: Not on file  Social Connections: Not on file    Family History  Problem Relation Age of Onset   Breast cancer Mother 7   Colon cancer Sister 73    Health Maintenance  Topic Date Due   Zoster Vaccines- Shingrix (1 of 2) Never done   COVID-19 Vaccine (3 - Booster for Pfizer series) 07/12/2020   PNA vac Low Risk Adult (1 of 2 - PCV13) 03/07/2021   INFLUENZA VACCINE  05/27/2021   TETANUS/TDAP  04/24/2025   COLONOSCOPY (Pts 45-45yr Insurance coverage will need to be confirmed)  09/03/2028   Hepatitis C Screening  Completed   HIV Screening  Completed   HPV VACCINES  Aged Out     ----------------------------------------------------------------------------------------------------------------------------------------------------------------------------------------------------------------- Physical Exam There were no vitals taken for this visit.  Physical Exam Constitutional:      Appearance: Normal appearance.  Eyes:     General: No  scleral icterus. Cardiovascular:     Rate and Rhythm: Normal rate and regular rhythm.  Pulmonary:     Effort: Pulmonary effort is normal.     Breath sounds: Normal breath sounds.  Musculoskeletal:     Cervical back: Neck supple.  Neurological:     General: No focal deficit present.     Mental Status: He is alert.  Psychiatric:        Mood and Affect: Mood normal.        Behavior: Behavior normal.    ------------------------------------------------------------------------------------------------------------------------------------------------------------------------------------------------------------------- Assessment and Plan  BPH (benign prostatic hyperplasia) Doxazosin continues to work well for him.  Updating PSA today.   Hyperlipidemia He is not taking rosuvastatin.  The 10-year  ASCVD risk score is: 39.7%.  Updating lipid panel. Encouraged statin use.      Left low back pain Given handout for HEP/spine conditioning.    Prediabetes Update a1c today  Hypertension BP remains elevated.  Declines medication despite elevated risk of heart disease.  He will check at home and continue to work on dietary change. F/u in 6 months.    No orders of the defined types were placed in this encounter.   No follow-ups on file.    This visit occurred during the SARS-CoV-2 public health emergency.  Safety protocols were in place, including screening questions prior to the visit, additional usage of staff PPE, and extensive cleaning of exam room while observing appropriate contact time as indicated for disinfecting solutions.

## 2021-07-12 LAB — LIPID PANEL W/REFLEX DIRECT LDL
Cholesterol: 239 mg/dL — ABNORMAL HIGH (ref <200)
HDL: 44 mg/dL (ref >=40)
LDL Cholesterol (Calc): 166 mg/dL (calc) — ABNORMAL HIGH
Non-HDL Cholesterol (Calc): 195 mg/dL (calc) — ABNORMAL HIGH (ref <130)
Total CHOL/HDL Ratio: 5.4 (calc) — ABNORMAL HIGH (ref <5.0)
Triglycerides: 149 mg/dL (ref <150)

## 2021-07-12 LAB — CBC WITH DIFFERENTIAL/PLATELET
Absolute Monocytes: 414 cells/uL (ref 200–950)
Basophils Absolute: 59 cells/uL (ref 0–200)
Basophils Relative: 1.3 %
Eosinophils Absolute: 72 cells/uL (ref 15–500)
Eosinophils Relative: 1.6 %
HCT: 46.5 % (ref 38.5–50.0)
Hemoglobin: 15.6 g/dL (ref 13.2–17.1)
Lymphs Abs: 1463 cells/uL (ref 850–3900)
MCH: 28.8 pg (ref 27.0–33.0)
MCHC: 33.5 g/dL (ref 32.0–36.0)
MCV: 85.8 fL (ref 80.0–100.0)
MPV: 10.2 fL (ref 7.5–12.5)
Monocytes Relative: 9.2 %
Neutro Abs: 2493 cells/uL (ref 1500–7800)
Neutrophils Relative %: 55.4 %
Platelets: 183 10*3/uL (ref 140–400)
RBC: 5.42 10*6/uL (ref 4.20–5.80)
RDW: 12.7 % (ref 11.0–15.0)
Total Lymphocyte: 32.5 %
WBC: 4.5 10*3/uL (ref 3.8–10.8)

## 2021-07-12 LAB — URINALYSIS, ROUTINE W REFLEX MICROSCOPIC
Bilirubin Urine: NEGATIVE
Glucose, UA: NEGATIVE
Hgb urine dipstick: NEGATIVE
Ketones, ur: NEGATIVE
Leukocytes,Ua: NEGATIVE
Nitrite: NEGATIVE
Protein, ur: NEGATIVE
Specific Gravity, Urine: 1.009 (ref 1.001–1.035)
pH: 5.5 (ref 5.0–8.0)

## 2021-07-12 LAB — COMPLETE METABOLIC PANEL WITH GFR
AG Ratio: 1.7 (calc) (ref 1.0–2.5)
ALT: 17 U/L (ref 9–46)
AST: 13 U/L (ref 10–35)
Albumin: 4.3 g/dL (ref 3.6–5.1)
Alkaline phosphatase (APISO): 50 U/L (ref 35–144)
BUN: 16 mg/dL (ref 7–25)
CO2: 30 mmol/L (ref 20–32)
Calcium: 9.2 mg/dL (ref 8.6–10.3)
Chloride: 102 mmol/L (ref 98–110)
Creat: 0.91 mg/dL (ref 0.70–1.35)
Globulin: 2.6 g/dL (calc) (ref 1.9–3.7)
Glucose, Bld: 135 mg/dL — ABNORMAL HIGH (ref 65–99)
Potassium: 4.7 mmol/L (ref 3.5–5.3)
Sodium: 138 mmol/L (ref 135–146)
Total Bilirubin: 0.4 mg/dL (ref 0.2–1.2)
Total Protein: 6.9 g/dL (ref 6.1–8.1)
eGFR: 94 mL/min/{1.73_m2} (ref >=60)

## 2021-07-12 LAB — HEMOGLOBIN A1C
Hgb A1c MFr Bld: 5.7 % of total Hgb — ABNORMAL HIGH (ref <5.7)
Mean Plasma Glucose: 117 mg/dL
eAG (mmol/L): 6.5 mmol/L

## 2021-07-12 LAB — PSA: PSA: 0.86 ng/mL (ref <=4.00)

## 2021-07-15 ENCOUNTER — Other Ambulatory Visit: Payer: Self-pay | Admitting: Family Medicine

## 2021-07-15 MED ORDER — ROSUVASTATIN CALCIUM 20 MG PO TABS: 20.0000 mg | ORAL_TABLET | Freq: Every day | ORAL | 3 refills | Status: AC

## 2021-09-28 DIAGNOSIS — Z03818 Encounter for observation for suspected exposure to other biological agents ruled out: Secondary | ICD-10-CM | POA: Diagnosis not present

## 2021-09-28 DIAGNOSIS — Z20822 Contact with and (suspected) exposure to covid-19: Secondary | ICD-10-CM | POA: Diagnosis not present

## 2022-01-08 ENCOUNTER — Ambulatory Visit: Payer: BC Managed Care – PPO | Admitting: Family Medicine

## 2022-04-04 ENCOUNTER — Other Ambulatory Visit: Payer: Self-pay | Admitting: Family Medicine

## 2022-04-04 NOTE — Telephone Encounter (Signed)
Called patient to schedule appointment and he stated that we should see that he is scheduled for a physical in September with PCP and he is not coming in before that as he does not come to the doctor for the fun of it, he only comes when he needs too and if he needs to find a new provider he will, but he does not see a need to come before his scheduled Physical appointment.

## 2022-04-04 NOTE — Telephone Encounter (Signed)
Pls contact pt to schedule 6 month follow-up. Past due since Feb. Sending 30 day meds to hold until appt.

## 2022-04-17 DIAGNOSIS — E7849 Other hyperlipidemia: Secondary | ICD-10-CM | POA: Diagnosis not present

## 2022-04-17 DIAGNOSIS — Z0000 Encounter for general adult medical examination without abnormal findings: Secondary | ICD-10-CM | POA: Diagnosis not present

## 2022-04-17 DIAGNOSIS — N400 Enlarged prostate without lower urinary tract symptoms: Secondary | ICD-10-CM | POA: Diagnosis not present

## 2022-07-04 ENCOUNTER — Encounter: Payer: BC Managed Care – PPO | Admitting: Family Medicine

## 2022-07-15 DIAGNOSIS — Z0000 Encounter for general adult medical examination without abnormal findings: Secondary | ICD-10-CM | POA: Diagnosis not present

## 2022-07-15 DIAGNOSIS — Z13220 Encounter for screening for lipoid disorders: Secondary | ICD-10-CM | POA: Diagnosis not present

## 2022-07-23 DIAGNOSIS — R7303 Prediabetes: Secondary | ICD-10-CM | POA: Diagnosis not present

## 2022-07-23 DIAGNOSIS — E7849 Other hyperlipidemia: Secondary | ICD-10-CM | POA: Diagnosis not present

## 2022-07-23 DIAGNOSIS — Z6829 Body mass index (BMI) 29.0-29.9, adult: Secondary | ICD-10-CM | POA: Diagnosis not present

## 2022-07-23 DIAGNOSIS — Z23 Encounter for immunization: Secondary | ICD-10-CM | POA: Diagnosis not present

## 2022-07-23 DIAGNOSIS — Z0000 Encounter for general adult medical examination without abnormal findings: Secondary | ICD-10-CM | POA: Diagnosis not present

## 2022-07-23 DIAGNOSIS — N200 Calculus of kidney: Secondary | ICD-10-CM | POA: Diagnosis not present

## 2022-07-23 DIAGNOSIS — Z85820 Personal history of malignant melanoma of skin: Secondary | ICD-10-CM | POA: Diagnosis not present

## 2022-08-20 DIAGNOSIS — D235 Other benign neoplasm of skin of trunk: Secondary | ICD-10-CM | POA: Diagnosis not present

## 2022-08-20 DIAGNOSIS — L281 Prurigo nodularis: Secondary | ICD-10-CM | POA: Diagnosis not present

## 2022-08-20 DIAGNOSIS — L570 Actinic keratosis: Secondary | ICD-10-CM | POA: Diagnosis not present

## 2022-08-20 DIAGNOSIS — L820 Inflamed seborrheic keratosis: Secondary | ICD-10-CM | POA: Diagnosis not present

## 2022-08-20 DIAGNOSIS — L814 Other melanin hyperpigmentation: Secondary | ICD-10-CM | POA: Diagnosis not present

## 2022-08-20 DIAGNOSIS — D485 Neoplasm of uncertain behavior of skin: Secondary | ICD-10-CM | POA: Diagnosis not present

## 2022-09-24 DIAGNOSIS — J069 Acute upper respiratory infection, unspecified: Secondary | ICD-10-CM | POA: Diagnosis not present

## 2022-10-28 DIAGNOSIS — J208 Acute bronchitis due to other specified organisms: Secondary | ICD-10-CM | POA: Diagnosis not present

## 2022-10-28 DIAGNOSIS — B9689 Other specified bacterial agents as the cause of diseases classified elsewhere: Secondary | ICD-10-CM | POA: Diagnosis not present

## 2023-01-19 DIAGNOSIS — E7849 Other hyperlipidemia: Secondary | ICD-10-CM | POA: Diagnosis not present

## 2023-01-19 DIAGNOSIS — R7303 Prediabetes: Secondary | ICD-10-CM | POA: Diagnosis not present

## 2023-01-22 DIAGNOSIS — E119 Type 2 diabetes mellitus without complications: Secondary | ICD-10-CM | POA: Diagnosis not present

## 2023-01-22 DIAGNOSIS — E1159 Type 2 diabetes mellitus with other circulatory complications: Secondary | ICD-10-CM | POA: Diagnosis not present

## 2023-01-22 DIAGNOSIS — I152 Hypertension secondary to endocrine disorders: Secondary | ICD-10-CM | POA: Diagnosis not present

## 2023-01-22 DIAGNOSIS — E785 Hyperlipidemia, unspecified: Secondary | ICD-10-CM | POA: Diagnosis not present

## 2023-01-22 DIAGNOSIS — R0602 Shortness of breath: Secondary | ICD-10-CM | POA: Diagnosis not present

## 2023-01-22 DIAGNOSIS — E1169 Type 2 diabetes mellitus with other specified complication: Secondary | ICD-10-CM | POA: Diagnosis not present

## 2023-01-22 DIAGNOSIS — R079 Chest pain, unspecified: Secondary | ICD-10-CM | POA: Diagnosis not present

## 2023-01-27 DIAGNOSIS — R0600 Dyspnea, unspecified: Secondary | ICD-10-CM | POA: Diagnosis not present

## 2023-01-27 DIAGNOSIS — R6889 Other general symptoms and signs: Secondary | ICD-10-CM | POA: Diagnosis not present

## 2023-01-27 DIAGNOSIS — I152 Hypertension secondary to endocrine disorders: Secondary | ICD-10-CM | POA: Diagnosis not present

## 2023-01-27 DIAGNOSIS — E1159 Type 2 diabetes mellitus with other circulatory complications: Secondary | ICD-10-CM | POA: Diagnosis not present

## 2023-02-05 DIAGNOSIS — R6889 Other general symptoms and signs: Secondary | ICD-10-CM | POA: Diagnosis not present

## 2023-02-05 DIAGNOSIS — R0600 Dyspnea, unspecified: Secondary | ICD-10-CM | POA: Diagnosis not present

## 2023-03-11 DIAGNOSIS — R079 Chest pain, unspecified: Secondary | ICD-10-CM | POA: Diagnosis not present

## 2023-04-20 DIAGNOSIS — E1159 Type 2 diabetes mellitus with other circulatory complications: Secondary | ICD-10-CM | POA: Diagnosis not present

## 2023-04-20 DIAGNOSIS — I152 Hypertension secondary to endocrine disorders: Secondary | ICD-10-CM | POA: Diagnosis not present

## 2023-04-20 DIAGNOSIS — E119 Type 2 diabetes mellitus without complications: Secondary | ICD-10-CM | POA: Diagnosis not present
